# Patient Record
Sex: Female | Born: 1985
Health system: Southern US, Community
[De-identification: ages and names within clinical notes are randomized; demographics above are authoritative.]

## PROBLEM LIST (undated history)

## (undated) ENCOUNTER — Inpatient Hospital Stay (HOSPITAL_COMMUNITY): Payer: Self-pay

## (undated) DIAGNOSIS — R51 Headache: Secondary | ICD-10-CM

## (undated) DIAGNOSIS — R519 Headache, unspecified: Secondary | ICD-10-CM

## (undated) DIAGNOSIS — T8859XA Other complications of anesthesia, initial encounter: Secondary | ICD-10-CM

## (undated) DIAGNOSIS — Z8632 Personal history of gestational diabetes: Secondary | ICD-10-CM

## (undated) DIAGNOSIS — T4145XA Adverse effect of unspecified anesthetic, initial encounter: Secondary | ICD-10-CM

## (undated) DIAGNOSIS — F419 Anxiety disorder, unspecified: Secondary | ICD-10-CM

## (undated) DIAGNOSIS — F410 Panic disorder [episodic paroxysmal anxiety] without agoraphobia: Secondary | ICD-10-CM

## (undated) HISTORY — PX: MOUTH SURGERY: SHX715

## (undated) HISTORY — PX: OTHER SURGICAL HISTORY: SHX169

## (undated) HISTORY — PX: TUBAL LIGATION: SHX77

---
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
## 2005-04-23 ENCOUNTER — Encounter (INDEPENDENT_AMBULATORY_CARE_PROVIDER_SITE_OTHER): Payer: Self-pay | Admitting: *Deleted

## 2005-04-23 ENCOUNTER — Observation Stay (HOSPITAL_COMMUNITY): Admission: RE | Admit: 2005-04-23 | Discharge: 2005-04-24 | Payer: Self-pay | Admitting: Obstetrics and Gynecology

## 2006-11-29 ENCOUNTER — Emergency Department (HOSPITAL_COMMUNITY): Admission: EM | Admit: 2006-11-29 | Discharge: 2006-11-29 | Payer: Self-pay | Admitting: Emergency Medicine

## 2009-04-10 ENCOUNTER — Inpatient Hospital Stay (HOSPITAL_COMMUNITY): Admission: AD | Admit: 2009-04-10 | Discharge: 2009-04-10 | Payer: Self-pay | Admitting: Obstetrics & Gynecology

## 2009-05-07 ENCOUNTER — Inpatient Hospital Stay (HOSPITAL_COMMUNITY): Admission: AD | Admit: 2009-05-07 | Discharge: 2009-05-09 | Payer: Self-pay | Admitting: Obstetrics and Gynecology

## 2010-05-23 ENCOUNTER — Encounter
Admission: RE | Admit: 2010-05-23 | Discharge: 2010-06-12 | Payer: Self-pay | Source: Home / Self Care | Attending: Obstetrics and Gynecology | Admitting: Obstetrics and Gynecology

## 2010-06-28 ENCOUNTER — Inpatient Hospital Stay (HOSPITAL_COMMUNITY)
Admission: AD | Admit: 2010-06-28 | Discharge: 2010-06-30 | DRG: 775 | Disposition: A | Discharge status: Home / Self Care | Payer: Medicaid Other | Source: Ambulatory Visit | Attending: Obstetrics and Gynecology | Admitting: Obstetrics and Gynecology

## 2010-06-28 ENCOUNTER — Other Ambulatory Visit: Payer: Self-pay | Admitting: Obstetrics and Gynecology

## 2010-06-28 DIAGNOSIS — O99814 Abnormal glucose complicating childbirth: Secondary | ICD-10-CM | POA: Diagnosis present

## 2010-06-28 LAB — CBC
MCH: 31.6 pg (ref 26.0–34.0)
MCHC: 33.4 g/dL (ref 30.0–36.0)
MCV: 94.6 fL (ref 78.0–100.0)
Platelets: 188 10*3/uL (ref 150–400)
RBC: 3.89 MIL/uL (ref 3.87–5.11)
RDW: 14.1 % (ref 11.5–15.5)

## 2010-06-28 LAB — RPR: RPR Ser Ql: NONREACTIVE

## 2010-06-29 LAB — CBC
Hemoglobin: 11 g/dL — ABNORMAL LOW (ref 12.0–15.0)
MCH: 31.3 pg (ref 26.0–34.0)
MCHC: 32.5 g/dL (ref 30.0–36.0)
Platelets: 151 10*3/uL (ref 150–400)
RDW: 14 % (ref 11.5–15.5)

## 2010-07-20 ENCOUNTER — Inpatient Hospital Stay (HOSPITAL_COMMUNITY): Admission: AD | Admit: 2010-07-20 | Payer: Self-pay | Admitting: Obstetrics and Gynecology

## 2010-08-13 LAB — RPR: RPR Ser Ql: NONREACTIVE

## 2010-08-13 LAB — CBC
HCT: 32.9 % — ABNORMAL LOW (ref 36.0–46.0)
Hemoglobin: 11.1 g/dL — ABNORMAL LOW (ref 12.0–15.0)
Platelets: 172 10*3/uL (ref 150–400)
RBC: 3.39 MIL/uL — ABNORMAL LOW (ref 3.87–5.11)
RBC: 4.01 MIL/uL (ref 3.87–5.11)
RDW: 13.6 % (ref 11.5–15.5)
WBC: 12.5 10*3/uL — ABNORMAL HIGH (ref 4.0–10.5)
WBC: 17.8 10*3/uL — ABNORMAL HIGH (ref 4.0–10.5)

## 2010-08-15 LAB — URINE MICROSCOPIC-ADD ON

## 2010-08-15 LAB — GC/CHLAMYDIA PROBE AMP, GENITAL
Chlamydia, DNA Probe: NEGATIVE
GC Probe Amp, Genital: NEGATIVE

## 2010-08-15 LAB — URINALYSIS, ROUTINE W REFLEX MICROSCOPIC
Bilirubin Urine: NEGATIVE
Specific Gravity, Urine: 1.015 (ref 1.005–1.030)
Urobilinogen, UA: 0.2 mg/dL (ref 0.0–1.0)
pH: 7 (ref 5.0–8.0)

## 2010-08-15 LAB — WET PREP, GENITAL
Trich, Wet Prep: NONE SEEN
Yeast Wet Prep HPF POC: NONE SEEN

## 2010-09-28 NOTE — Op Note (Signed)
Samantha [name], Samantha [name]                ACCOUNT NO.:  [id number]   MEDICAL RECORD NO.:  [id number]          PATIENT TYPE:  OBV   LOCATION:  9306                          FACILITY:  [facility]   PHYSICIAN:  Carrington Clamp, M.D. DATE OF BIRTH:  [birth year]   DATE OF PROCEDURE:  04/23/2005  DATE OF DISCHARGE:                                 OPERATIVE REPORT   PREOPERATIVE DIAGNOSIS:  Is right lower quadrant pain with a left mass  associated with the left ovary.   POSTOPERATIVE DIAGNOSIS:  Is left TOA and adhesions.   PROCEDURES:  Diagnostic operative laparoscopy with left salpingectomy and  lysis of adhesions.   SURGEON:  Carrington Clamp, M.D.   ASSISTANT:  Dr. Greta Doom   ANESTHESIA:  General.   SPECIMENS:  Left salpinx. Culture was also sent of the pus.   ESTIMATED BLOOD LOSS:  Was minimal.   IV FLUIDS:  1500 cc.   URINE OUTPUT:  Was not measured.   COMPLICATIONS:  Were none.   FINDINGS:  Were left tubal ovarian abscess with the bowel and the omentum  adhesed to the left tube. The bowel was intact. The left tube was grossly  abnormal, dilated with an irregular shape. The right tube appeared to be  normal except for some clubbing at the end and small amount adhesions to the  ovary. The right ovary was adhesed to the appendix at the tip but the  appendix was otherwise normal. There no Fitz-Hugh-Curtis adhesions. Both  ovaries and uterus appeared normal. Medications are Hyskon and Interceed.  Counts were correct x3.   TECHNIQUE:  After adequate general anesthesia was achieved the patient was  prepped, draped in sterile fashion in dorsal lithotomy position. A speculum  was placed in the vagina and the uterine manipulator placed on the cervix.  The bladder was drained with a red rubber catheter during the procedure and  the speculum was removed from the vagina. Attention was turned to the  umbilicus where a 1 cm infraumbilical incision was made with the scalpel.  The Veress needle was  passed into the abdomen without aspiration of bowel  contents or blood. The 10 mm trocar was passed into the abdomen without  complication. The two 5 mm trocars placed one midline and one to the left of  the round ligament. The large mass of omentum and bowel was noted adhesed to  the anterior abdominal wall and this was carefully retracted with blunt  dissection with the probe and with the suction dilator. Pus was seen  emerging from this area and a sample pus was sent for culture. The blunt  dissection was continued until the omentum was removed and the tube and  bowel could be seen. Careful sharp dissection was used to retract the tube  from the anterior abdomen and the tube and bowel were then removed from each  other with careful blunt and sharp dissection. The bowel was intact and was  uninjured and the left tube was identified as being grossly dilated and  abnormal. It was difficult to see the fimbria but it was believed that the  fimbria were intact. However, they had been grossly adhesed and there was a  lot of the oozing and some bleeding of this area. Because of the likelihood  that this tube with ever be functional and because of the amount of bleeding  that was being seen from the bed of the tube it was decided to remove the  tube as she looked like she had a right tube that was healthier. The triple  polar was used to remove the tube at the cornea and from the mesosalpinx.  All the dissection was done superior to the ovary and therefore the  infundibular pelvic ligament and the ureters were well out of the field of  dissection. The tube was then removed in an Endo bag and the remaining stump  at the cornua and the incision bed was hemostatic. A piece of intercede was  placed over the entire area of both the bowel and the mesosalpinx to try and  cover it to prevent readhesion of the omentum and or any other bowel. The  water and blood were irrigated from the abdomen and then 50  mL of Hyskon  were injected. Before this had happened the appendix was released from the  right ovary with blunt dissection. It was intact and without abnormalities  and therefore was left alone, tucked up towards the cecum again. The  instruments were then withdrawn from the abdomen and the abdomen  desufflated. The 10 mL trocar site fascia was closed with a figure-of-eight  stitch of 2-0 Vicryl. Two through and through stitches were used to close  each of the smaller 5 mm incision and then all skin incisions were closed  with Dermabond. All instruments withdrawn from the vagina and the patient  tolerated the procedure well. She returned to recovery room in stable  condition.      Carrington Clamp, M.D.  Electronically Signed     MH/MEDQ  D:  04/23/2005  T:  04/23/2005  Job:  [id number]

## 2010-09-28 NOTE — Discharge Summary (Signed)
NAME:  Samantha [name], Samantha [name]                          ACCOUNT NO.:  [id number]   MEDICAL RECORD NO.:  [id number]                   PATIENT TYPE:  INP   LOCATION:  9125                                 FACILITY:  [facility]   PHYSICIAN:  Gerrit Friends. Aldona Bar, M.D.                DATE OF BIRTH:  02-Jan-1986   DATE OF ADMISSION:  05/21/2003  DATE OF DISCHARGE:  05/23/2003                                 DISCHARGE SUMMARY   DISCHARGE DIAGNOSES:  1. Term pregnancy delivered, 6 pound, one ounce female infant with Apgar's     of 8 and 9.  2. Blood type O positive.   PROCEDURES:  1. Normal spontaneous delivery.  2. Second degree tear and repair.   SUMMARY:  This 25 year old primigravida was admitted at term in active  labor.  She progressed and had a normal spontaneous delivery over a second  degree tear of a 6 pound, one ounce female infant with good Apgar's.  The  tear was repaired without difficulty and the patient's postpartum course was  benign.   Discharge hemoglobin 11.3 with a white count of 15,500 and a platelet count  of 157,000.  On the morning of May 23, 2003 she was ambulating well,  tolerating a regular diet well, having normal bowel and bladder function and  was afebrile.  She was breast feeding without difficulty and was discharged  to home.   She was given all appropriate instructions and understood instructions well.   DISCHARGE MEDICATIONS:  Vitamins - one a day, and Motrin 600 mg q.6h p.r.n.  for pain.   FOLLOW UP:  She will return to the office for follow up in approximately  four week's time or as needed.   Condition on discharge is improved.                                               Gerrit Friends. Aldona Bar, M.D.    RMW/MEDQ  D:  05/23/2003  T:  05/23/2003  Job:  [id number]

## 2011-02-25 LAB — URINALYSIS, ROUTINE W REFLEX MICROSCOPIC
Nitrite: NEGATIVE
Specific Gravity, Urine: 1.019
Urobilinogen, UA: 0.2
pH: 6.5

## 2011-02-25 LAB — GC/CHLAMYDIA PROBE AMP, GENITAL: Chlamydia, DNA Probe: NEGATIVE

## 2011-02-25 LAB — POCT PREGNANCY, URINE
Operator id: 196461
Preg Test, Ur: NEGATIVE

## 2011-02-25 LAB — URINE MICROSCOPIC-ADD ON

## 2011-06-06 ENCOUNTER — Ambulatory Visit: Payer: 59 | Attending: Family Medicine

## 2011-06-06 DIAGNOSIS — M25559 Pain in unspecified hip: Secondary | ICD-10-CM | POA: Insufficient documentation

## 2011-06-06 DIAGNOSIS — M545 Low back pain, unspecified: Secondary | ICD-10-CM | POA: Insufficient documentation

## 2011-06-06 DIAGNOSIS — IMO0001 Reserved for inherently not codable concepts without codable children: Secondary | ICD-10-CM | POA: Insufficient documentation

## 2011-06-06 DIAGNOSIS — F411 Generalized anxiety disorder: Secondary | ICD-10-CM | POA: Insufficient documentation

## 2011-06-10 ENCOUNTER — Ambulatory Visit: Payer: 59 | Admitting: Physical Therapy

## 2011-06-13 ENCOUNTER — Ambulatory Visit: Payer: 59 | Admitting: Physical Therapy

## 2011-06-18 ENCOUNTER — Ambulatory Visit: Payer: 59 | Attending: Family Medicine | Admitting: Physical Therapy

## 2011-06-18 DIAGNOSIS — M545 Low back pain, unspecified: Secondary | ICD-10-CM | POA: Insufficient documentation

## 2011-06-18 DIAGNOSIS — IMO0001 Reserved for inherently not codable concepts without codable children: Secondary | ICD-10-CM | POA: Insufficient documentation

## 2011-06-18 DIAGNOSIS — M25559 Pain in unspecified hip: Secondary | ICD-10-CM | POA: Insufficient documentation

## 2011-06-18 DIAGNOSIS — F411 Generalized anxiety disorder: Secondary | ICD-10-CM | POA: Insufficient documentation

## 2011-06-20 ENCOUNTER — Ambulatory Visit: Payer: 59 | Admitting: Physical Therapy

## 2011-06-25 ENCOUNTER — Encounter: Payer: 59 | Admitting: Physical Therapy

## 2011-06-27 ENCOUNTER — Ambulatory Visit: Payer: 59 | Admitting: Physical Therapy

## 2013-01-08 ENCOUNTER — Encounter: Payer: Self-pay | Admitting: Radiology

## 2015-02-24 ENCOUNTER — Inpatient Hospital Stay (HOSPITAL_COMMUNITY)
Admission: AD | Admit: 2015-02-24 | Discharge: 2015-02-25 | Discharge status: Against Medical Advice | Payer: Medicaid Other | Source: Ambulatory Visit | Attending: Obstetrics and Gynecology | Admitting: Obstetrics and Gynecology

## 2015-02-24 DIAGNOSIS — Z5320 Procedure and treatment not carried out because of patient's decision for unspecified reasons: Secondary | ICD-10-CM | POA: Diagnosis not present

## 2015-02-24 DIAGNOSIS — R109 Unspecified abdominal pain: Secondary | ICD-10-CM | POA: Diagnosis present

## 2015-02-24 LAB — URINALYSIS, ROUTINE W REFLEX MICROSCOPIC
Bilirubin Urine: NEGATIVE
Glucose, UA: NEGATIVE mg/dL
Hgb urine dipstick: NEGATIVE
Ketones, ur: NEGATIVE mg/dL
LEUKOCYTES UA: NEGATIVE
NITRITE: NEGATIVE
PH: 6 (ref 5.0–8.0)
Protein, ur: NEGATIVE mg/dL
SPECIFIC GRAVITY, URINE: 1.025 (ref 1.005–1.030)
Urobilinogen, UA: 0.2 mg/dL (ref 0.0–1.0)

## 2015-02-24 LAB — POCT PREGNANCY, URINE: Preg Test, Ur: POSITIVE — AB

## 2015-02-24 NOTE — MAU Note (Signed)
Positive home upt today. LMP 01/25/15. Occ cramping but no bleeding.

## 2015-02-25 NOTE — MAU Note (Signed)
Pt not in lobby.  

## 2015-02-25 NOTE — MAU Note (Signed)
Lab called pt earlier from lobby for blood work and pt not in lobby. PT now called and not in lobby.

## 2015-02-26 ENCOUNTER — Encounter (HOSPITAL_COMMUNITY): Payer: Self-pay | Admitting: *Deleted

## 2015-02-26 ENCOUNTER — Inpatient Hospital Stay (HOSPITAL_COMMUNITY): Payer: Medicaid Other

## 2015-02-26 ENCOUNTER — Inpatient Hospital Stay (HOSPITAL_COMMUNITY)
Admission: AD | Admit: 2015-02-26 | Discharge: 2015-02-26 | Disposition: A | Discharge status: Home / Self Care | Payer: Medicaid Other | Source: Ambulatory Visit | Attending: Obstetrics & Gynecology | Admitting: Obstetrics & Gynecology

## 2015-02-26 DIAGNOSIS — O4691 Antepartum hemorrhage, unspecified, first trimester: Secondary | ICD-10-CM

## 2015-02-26 DIAGNOSIS — O26899 Other specified pregnancy related conditions, unspecified trimester: Secondary | ICD-10-CM

## 2015-02-26 DIAGNOSIS — O209 Hemorrhage in early pregnancy, unspecified: Secondary | ICD-10-CM

## 2015-02-26 DIAGNOSIS — Z3491 Encounter for supervision of normal pregnancy, unspecified, first trimester: Secondary | ICD-10-CM | POA: Insufficient documentation

## 2015-02-26 DIAGNOSIS — R109 Unspecified abdominal pain: Secondary | ICD-10-CM

## 2015-02-26 HISTORY — DX: Personal history of gestational diabetes: Z86.32

## 2015-02-26 LAB — CBC
HCT: 40.2 % (ref 36.0–46.0)
Hemoglobin: 13.6 g/dL (ref 12.0–15.0)
MCH: 33.5 pg (ref 26.0–34.0)
MCHC: 33.8 g/dL (ref 30.0–36.0)
MCV: 99 fL (ref 78.0–100.0)
PLATELETS: 181 10*3/uL (ref 150–400)
RBC: 4.06 MIL/uL (ref 3.87–5.11)
RDW: 12.7 % (ref 11.5–15.5)
WBC: 8.8 10*3/uL (ref 4.0–10.5)

## 2015-02-26 LAB — URINALYSIS, ROUTINE W REFLEX MICROSCOPIC
BILIRUBIN URINE: NEGATIVE
Glucose, UA: NEGATIVE mg/dL
Hgb urine dipstick: NEGATIVE
KETONES UR: NEGATIVE mg/dL
LEUKOCYTES UA: NEGATIVE
NITRITE: NEGATIVE
PH: 6 (ref 5.0–8.0)
PROTEIN: NEGATIVE mg/dL
Specific Gravity, Urine: >1.03 — ABNORMAL HIGH (ref 1.005–1.030)
UROBILINOGEN UA: 0.2 mg/dL (ref 0.0–1.0)

## 2015-02-26 LAB — HCG, QUANTITATIVE, PREGNANCY: HCG, BETA CHAIN, QUANT, S: 1836 m[IU]/mL — AB (ref <5)

## 2015-02-26 LAB — ABO/RH: ABO/RH(D): O POS

## 2015-02-26 LAB — WET PREP, GENITAL
CLUE CELLS WET PREP: NONE SEEN
TRICH WET PREP: NONE SEEN
YEAST WET PREP: NONE SEEN

## 2015-02-26 NOTE — MAU Provider Note (Signed)
Chief Complaint: Abdominal Pain   First Provider Initiated Contact with Patient 03/18/2015 at 02:53 PM   SUBJECTIVE HPI: Samantha [name] is a 29 y.o. G4P3003 at [redacted]w[redacted]d who presents to [facility] pain and pink spotting.   Location: Low abd Quality: cramping Severity: 3/10 on pain scale Duration: <24 hours Course: Unchanged Context: none Timing: Constant Modifying factors: None. Hasn't tried anything for pain Associated signs and symptoms: Pos for spotting. Neg for fever, chills, passage of clots of tissue, vaginal discharge.   Past Medical History  Diagnosis Date  . History of gestational diabetes    OB History  Gravida Para Term Preterm AB SAB TAB Ectopic Multiple Living  # Outcome Date GA Lbr Len/2nd Weight Sex Delivery Anes PTL Lv  4 Current           3 Term      Vag-Spont     2 Term      Vag-Spont     1 Term      Vag-Spont        Past Surgical History  Procedure Laterality Date  . Tonsills remove    . Fallopian tube removal    . Mouth surgery     Social History   Social History  . Marital Status: Single    Spouse Name: N/A  . Number of Children: N/A  . Years of Education: N/A   Occupational History  . Not on file.   Social History Main Topics  . Smoking status: Current Some Day Smoker  . Smokeless tobacco: Not on file  . Alcohol Use: No  . Drug Use: No  . Sexual Activity: Yes    Birth Control/ Protection: None   Other Topics Concern  . Not on file   Social History Narrative   No current facility-administered medications on file prior to encounter.   No current outpatient prescriptions on file prior to encounter.   No Known Allergies  I have reviewed the past Medical Hx, Surgical Hx, Social Hx, Allergies and Medications.   abdominal pain in the lower midline, vaginal bleeding. Neg for dysuria, hemeturia, urgency, frequency, flank pain, fever, chills, diarrhea, constipation, passage or clots or  tissue.  OBJECTIVE Patient Vitals for the past 24 hrs:  BP Temp Temp src Pulse Resp Height Weight  02/26/15 1145 110/75 mmHg 98 F (36.7 C) Oral 82 16 5' 4.25" (1.632 m) 101 lb 4 oz (45.927 kg)   Constitutional: Well-developed, well-nourished female in no acute distress.  Cardiovascular: normal rate Respiratory: normal rate and effort.  GI: Abd soft, non-tender. Fundus non-palpable. MS: Extremities nontender, no edema, normal ROM Neurologic: Alert and oriented x 4.  GU: Neg CVAT.  SPECULUM EXAM: NEFG, physiologic discharge, no blood noted, cervix clean  BIMANUAL: cervix closed; uterus normal size, no adnexal tenderness or masses. No CMT.  LAB RESULTS Results for orders placed or performed during the hospital encounter of 02/26/15 (from the past 24 hour(s))  Urinalysis, Routine w reflex microscopic (not at [facility])     Status: Abnormal   Collection Time: 02/26/15 11:54 AM  Result Value Ref Range   Color, Urine YELLOW YELLOW   APPearance CLEAR CLEAR   Specific Gravity, Urine >1.030 (H) 1.005 - 1.030   pH 6.0 5.0 - 8.0   Glucose, UA NEGATIVE NEGATIVE mg/dL   Hgb urine dipstick NEGATIVE NEGATIVE   Bilirubin Urine NEGATIVE NEGATIVE   Ketones, ur NEGATIVE NEGATIVE mg/dL  Protein, ur NEGATIVE NEGATIVE mg/dL   Urobilinogen, UA 0.2 0.0 - 1.0 mg/dL   Nitrite NEGATIVE NEGATIVE   Leukocytes, UA NEGATIVE NEGATIVE  hCG, quantitative, pregnancy     Status: Abnormal   Collection Time: 02/26/15 12:25 PM  Result Value Ref Range   hCG, Beta Chain, Quant, S 1836 (H) <5 mIU/mL  CBC     Status: None   Collection Time: 02/26/15 12:25 PM  Result Value Ref Range   WBC 8.8 4.0 - 10.5 K/uL   RBC 4.06 3.87 - 5.11 MIL/uL   Hemoglobin 13.6 12.0 - 15.0 g/dL   HCT 93.740.2 90.236.0 - 40.946.0 %   MCV 99.0 78.0 - 100.0 fL   MCH 33.5 26.0 - 34.0 pg   MCHC 33.8 30.0 - 36.0 g/dL   RDW 73.512.7 32.911.5 - 92.415.5 %   Platelets 181 150 - 400 K/uL  ABO/Rh     Status: None   Collection Time: 02/26/15 12:25 PM  Result Value Ref  Range   ABO/RH(D) O POS     IMAGING [place] Ob Comp Less 14 Wks  02/26/2015  CLINICAL DATA:  Abdominal pain and bleeding. 4 weeks 4 days pregnant. Beta HCG of 1,800. EXAM: OBSTETRIC <14 WK US AND TRANSVAGINAL OB US TECHNIQUE: Both transabdominal and transvaginal ultrasound examinations were performed for complete evaluation of the gestation as well as the maternal uterus, adnexal regions, and pelvic cul-de-sac. Transvaginal technique was performed to assess early pregnancy. COMPARISON:  Pelvic ultrasound of 11/29/2006 FINDINGS: Intrauterine gestational sac: Likely visualized; tiny fluid collection within the endometrium. Yolk sac:  Absent Embryo:  Absent Cardiac Activity: Absent MSD: 3  mm   5 w   0  d Maternal uterus/adnexae: No evidence of subchorionic hemorrhage. Probable right ovarian corpus luteal cyst. Normal left ovary. Trace free pelvic fluid is likely physiologic. IMPRESSION: 1. Tiny fluid collection within the endometrium is likely an early gestational sac. This corresponds to a gestational age of approximately 5 weeks 0 days. 2. Right ovarian corpus luteal cyst. Electronically Signed   By: Jeronimo GreavesKyle  Talbot M.D.   On: 02/26/2015 13:59   [place] Ob Transvaginal  02/26/2015  CLINICAL DATA:  Abdominal pain and bleeding. 4 weeks 4 days pregnant. Beta HCG of 1,800. EXAM: OBSTETRIC <14 WK US AND TRANSVAGINAL OB US TECHNIQUE: Both transabdominal and transvaginal ultrasound examinations were performed for complete evaluation of the gestation as well as the maternal uterus, adnexal regions, and pelvic cul-de-sac. Transvaginal technique was performed to assess early pregnancy. COMPARISON:  Pelvic ultrasound of 11/29/2006 FINDINGS: Intrauterine gestational sac: Likely visualized; tiny fluid collection within the endometrium. Yolk sac:  Absent Embryo:  Absent Cardiac Activity: Absent MSD: 3  mm   5 w   0  d Maternal uterus/adnexae: No evidence of subchorionic hemorrhage. Probable right ovarian corpus luteal cyst. Normal  left ovary. Trace free pelvic fluid is likely physiologic. IMPRESSION: 1. Tiny fluid collection within the endometrium is likely an early gestational sac. This corresponds to a gestational age of approximately 5 weeks 0 days. 2. Right ovarian corpus luteal cyst. Electronically Signed   By: Jeronimo GreavesKyle  Talbot M.D.   On: 02/26/2015 13:59    MAU COURSE CBC, CMP, ABO/Rh, ultrasound, wet prep and GC/chlamydia culture, UA  MDM Pain and bleeding in early pregnancy with pregnancy of unknown anatomic location, but hemodynamically stable.  ASSESSMENT 1. Vaginal bleeding in pregnancy, first trimester   2. Abdominal pain affecting pregnancy, antepartum   3. Vaginal bleeding in pregnancy, first trimester   4. Abdominal pain  affecting pregnancy, antepartum    PLAN Discharge home in stable condition. Bleeding precautions Follow-up Information    Follow up with [facility] [place] [facility] In 2 days.   Why:  For repeat blood work or sooner as needed if symptoms worsen   Contact information:   [address] [id number] mc [place] [address] [phone]       Medication List    TAKE these medications        [facility] PRENATAL GUMMY PO  Take 2 capsules by mouth daily.         [place], CNM 02/26/2015  2:53 PM

## 2015-02-26 NOTE — MAU Note (Signed)
Patient presents at [redacted] weeks gestation with c/o abdominal pain since last night. Denies bleeding but did note a pink discharge when wiping after voiding this morning.

## 2015-02-26 NOTE — Discharge Instructions (Signed)
Abdominal Pain During Pregnancy Abdominal pain is common in pregnancy. Most of the time, it does not cause harm. There are many causes of abdominal pain. Some causes are more serious than others. Some of the causes of abdominal pain in pregnancy are easily diagnosed. Occasionally, the diagnosis takes time to understand. Other times, the cause is not determined. Abdominal pain can be a sign that something is very wrong with the pregnancy, or the pain may have nothing to do with the pregnancy at all. For this reason, always tell your health care provider if you have any abdominal discomfort. HOME CARE INSTRUCTIONS  Monitor your abdominal pain for any changes. The following actions may help to alleviate any discomfort you are experiencing:  Do not have sexual intercourse or put anything in your vagina until your symptoms go away completely.  Get plenty of rest until your pain improves.  Drink clear fluids if you feel nauseous. Avoid solid food as long as you are uncomfortable or nauseous.  Only take over-the-counter or prescription medicine as directed by your health care provider.  Keep all follow-up appointments with your health care provider. SEEK IMMEDIATE MEDICAL CARE IF:  You are bleeding, leaking fluid, or passing tissue from the vagina.  You have increasing pain or cramping.  You have persistent vomiting.  You have painful or bloody urination.  You have a fever.  You notice a decrease in your baby's movements.  You have extreme weakness or feel faint.  You have shortness of breath, with or without abdominal pain.  You develop a severe headache with abdominal pain.  You have abnormal vaginal discharge with abdominal pain.  You have persistent diarrhea.  You have abdominal pain that continues even after rest, or gets worse. MAKE SURE YOU:   Understand these instructions.  Will watch your condition.  Will get help right away if you are not doing well or get worse.     This information is not intended to replace advice given to you by your health care provider. Make sure you discuss any questions you have with your health care provider.   Document Released: 04/29/2005 Document Revised: 02/17/2013 Document Reviewed: 11/26/2012 Elsevier Interactive Patient Education 2016 Elsevier Inc.  Ectopic Pregnancy An ectopic pregnancy is when the fertilized egg attaches (implants) outside the uterus. Most ectopic pregnancies occur in the fallopian tube. Rarely do ectopic pregnancies occur on the ovary, intestine, pelvis, or cervix. In an ectopic pregnancy, the fertilized egg does not have the ability to develop into a normal, healthy baby.  A ruptured ectopic pregnancy is one in which the fallopian tube gets torn or bursts and results in internal bleeding. Often there is intense abdominal pain, and sometimes, vaginal bleeding. Having an ectopic pregnancy can be life threatening. If left untreated, this dangerous condition can lead to a blood transfusion, abdominal surgery, or even death. CAUSES  Damage to the fallopian tubes is the suspected cause in most ectopic pregnancies.  RISK FACTORS Depending on your circumstances, the risk of having an ectopic pregnancy will vary. The level of risk can be divided into three categories. High Risk  You have gone through infertility treatment.  You have had a previous ectopic pregnancy.  You have had previous tubal surgery.  You have had previous surgery to have the fallopian tubes tied (tubal ligation).  You have tubal problems or diseases.  You have been exposed to DES. DES is a medicine that was used until 1971 and had effects on babies whose mothers took the  medicine.  You become pregnant while using an intrauterine device (IUD) for birth control. Moderate Risk  You have a history of infertility.  You have a history of a sexually transmitted infection (STI).  You have a history of pelvic inflammatory disease  (PID).  You have scarring from endometriosis.  You have multiple sexual partners.  You smoke. Low Risk  You have had previous pelvic surgery.  You use vaginal douching.  You became sexually active before 29 years of age. SIGNS AND SYMPTOMS  An ectopic pregnancy should be suspected in anyone who has missed a period and has abdominal pain or bleeding.  You may experience normal pregnancy symptoms, such as:  Nausea.  Tiredness.  Breast tenderness.  Other symptoms may include:  Pain with intercourse.  Irregular vaginal bleeding or spotting.  Cramping or pain on one side or in the lower abdomen.  Fast heartbeat.  Passing out while having a bowel movement.  Symptoms of a ruptured ectopic pregnancy and internal bleeding may include:  Sudden, severe pain in the abdomen and pelvis.  Dizziness or fainting.  Pain in the shoulder area. DIAGNOSIS  Tests that may be performed include:  A pregnancy test.  An ultrasound test.  Testing the specific level of pregnancy hormone in the bloodstream.  Taking a sample of uterus tissue (dilation and curettage, D&C).  Surgery to perform a visual exam of the inside of the abdomen using a thin, lighted tube with a tiny camera on the end (laparoscope). TREATMENT  An injection of a medicine called methotrexate may be given. This medicine causes the pregnancy tissue to be absorbed. It is given if:  The diagnosis is made early.  The fallopian tube has not ruptured.  You are considered to be a good candidate for the medicine. Usually, pregnancy hormone blood levels are checked after methotrexate treatment. This is to be sure the medicine is effective. It may take 4-6 weeks for the pregnancy to be absorbed (though most pregnancies will be absorbed by 3 weeks). Surgical treatment may be needed. A laparoscope may be used to remove the pregnancy tissue. If severe internal bleeding occurs, a cut (incision) may be made in the lower abdomen  (laparotomy), and the ectopic pregnancy is removed. This stops the bleeding. Part of the fallopian tube, or the whole tube, may be removed as well (salpingectomy). After surgery, pregnancy hormone tests may be done to be sure there is no pregnancy tissue left. You may receive a Rho (D) immune globulin shot if you are Rh negative and the father is Rh positive, or if you do not know the Rh type of the father. This is to prevent problems with any future pregnancy. SEEK IMMEDIATE MEDICAL CARE IF:  You have any symptoms of an ectopic pregnancy. This is a medical emergency. MAKE SURE YOU:  Understand these instructions.  Will watch your condition.  Will get help right away if you are not doing well or get worse.   This information is not intended to replace advice given to you by your health care provider. Make sure you discuss any questions you have with your health care provider.   Document Released: 06/06/2004 Document Revised: 05/20/2014 Document Reviewed: 11/26/2012 Elsevier Interactive Patient Education [employer]2016 Elsevier Inc.

## 2015-02-27 LAB — GC/CHLAMYDIA PROBE AMP (~~LOC~~) NOT AT ARMC
Chlamydia: NEGATIVE
Neisseria Gonorrhea: NEGATIVE

## 2015-02-27 LAB — HIV ANTIBODY (ROUTINE TESTING W REFLEX): HIV Screen 4th Generation wRfx: NONREACTIVE

## 2015-03-05 ENCOUNTER — Inpatient Hospital Stay (HOSPITAL_COMMUNITY): Payer: Medicaid Other

## 2015-03-05 ENCOUNTER — Inpatient Hospital Stay (HOSPITAL_COMMUNITY)
Admission: AD | Admit: 2015-03-05 | Discharge: 2015-03-05 | Disposition: A | Discharge status: Home / Self Care | Payer: Medicaid Other | Source: Ambulatory Visit | Attending: Family Medicine | Admitting: Family Medicine

## 2015-03-05 DIAGNOSIS — Z3491 Encounter for supervision of normal pregnancy, unspecified, first trimester: Secondary | ICD-10-CM

## 2015-03-05 DIAGNOSIS — Z3A01 Less than 8 weeks gestation of pregnancy: Secondary | ICD-10-CM | POA: Diagnosis not present

## 2015-03-05 DIAGNOSIS — R102 Pelvic and perineal pain: Secondary | ICD-10-CM | POA: Diagnosis present

## 2015-03-05 DIAGNOSIS — O26891 Other specified pregnancy related conditions, first trimester: Secondary | ICD-10-CM | POA: Diagnosis not present

## 2015-03-05 NOTE — MAU Provider Note (Signed)
S: 29 y.o. [id number] @[redacted]w[redacted]d  by LMP/U/S presents to MAU for follow up quant hcg. She presented on 02/26/15 with abdominal pain and spotting with positive home pregnancy test.  She had quant hcg of 1836 on 10/16 with no evidence of IUP or ectopic on ultrasound.  She did not return on 10/18 as instructed but has returned today to MAU for follow up.  She denies pain or bleeding today.  Both resolved spontaneously without treatment.  O: [place] Ob Transvaginal  03/05/2015  CLINICAL DATA:  Pregnant, pain, evaluate for viability EXAM: TRANSVAGINAL OB ULTRASOUND TECHNIQUE: Transvaginal ultrasound was performed for complete evaluation of the gestation as well as the maternal uterus, adnexal regions, and pelvic cul-de-sac. COMPARISON:  02/26/2015 FINDINGS: Intrauterine gestational sac: Visualized/normal in shape. Yolk sac:  Present Embryo:  Not visualized MSD: 11  mm   5 w   6  d Maternal uterus/adnexae: No subchronic hemorrhage. Bilateral ovaries are within normal limits. No free fluid. IMPRESSION: Single intrauterine gestational sac with yolk sac, measuring 5 weeks 6 days by mean sac diameter. No fetal pole is visualized. Consider follow-up pelvic ultrasound in 14 days to confirm viability as clinically warranted. Electronically Signed   By: Charline BillsSriyesh  Krishnan M.D.   On: 03/05/2015 10:50   VS reviewed, nursing note reviewed,  Constitutional: well developed, well nourished, no distress HEENT: normocephalic CV: normal rate Pulm/chest wall: normal effort Abdomen: soft Neuro: alert and oriented x 3 Skin: warm, dry Psych: affect normal  --/--/O POS (10/16 1225)  A: 1. Normal IUP (intrauterine pregnancy) on prenatal ultrasound, first trimester   2. Pelvic pain affecting pregnancy in first trimester, antepartum    P: D/C home [facility] First trimester teaching/precautions given Return to MAU as needed for emergencies   LEFTWICH-KIRBY, Gavin Telford, CNM 11:33 AM

## 2015-03-05 NOTE — MAU Note (Signed)
Was unable to return when expected, "kids were out of school".  No real complaints today.  No pain or bleeding.

## 2015-03-05 NOTE — Discharge Instructions (Signed)
First Trimester of Pregnancy  The first trimester of pregnancy is from week 1 until the end of week 12 (months 1 through 3). A week after a sperm fertilizes an egg, the egg will implant on the wall of the uterus. This embryo will begin to develop into a baby. Genes from you and your partner are forming the baby. The female genes determine whether the baby is a boy or a girl. At 6-8 weeks, the eyes and face are formed, and the heartbeat can be seen on ultrasound. At the end of 12 weeks, all the baby's organs are formed.   Now that you are pregnant, you will want to do everything you can to have a healthy baby. Two of the most important things are to get good prenatal care and to follow your health care provider's instructions. Prenatal care is all the medical care you receive before the baby's birth. This care will help prevent, find, and treat any problems during the pregnancy and childbirth.  BODY CHANGES  Your body goes through many changes during pregnancy. The changes vary from woman to woman.   · You may gain or lose a couple of pounds at first.  · You may feel sick to your stomach (nauseous) and throw up (vomit). If the vomiting is uncontrollable, call your health care provider.  · You may tire easily.  · You may develop headaches that can be relieved by medicines approved by your health care provider.  · You may urinate more often. Painful urination may mean you have a bladder infection.  · You may develop heartburn as a result of your pregnancy.  · You may develop constipation because certain hormones are causing the muscles that push waste through your intestines to slow down.  · You may develop hemorrhoids or swollen, bulging veins (varicose veins).  · Your breasts may begin to grow larger and become tender. Your nipples may stick out more, and the tissue that surrounds them (areola) may become darker.  · Your gums may bleed and may be sensitive to brushing and flossing.   · Dark spots or blotches (chloasma, mask of pregnancy) may develop on your face. This will likely fade after the baby is born.  · Your menstrual periods will stop.  · You may have a loss of appetite.  · You may develop cravings for certain kinds of food.  · You may have changes in your emotions from day to day, such as being excited to be pregnant or being concerned that something may go wrong with the pregnancy and baby.  · You may have more vivid and strange dreams.  · You may have changes in your hair. These can include thickening of your hair, rapid growth, and changes in texture. Some women also have hair loss during or after pregnancy, or hair that feels dry or thin. Your hair will most likely return to normal after your baby is born.  WHAT TO EXPECT AT YOUR PRENATAL VISITS  During a routine prenatal visit:  · You will be weighed to make sure you and the baby are growing normally.  · Your blood pressure will be taken.  · Your abdomen will be measured to track your baby's growth.  · The fetal heartbeat will be listened to starting around week 10 or 12 of your pregnancy.  · Test results from any previous visits will be discussed.  Your health care provider may ask you:  · How you are feeling.  · If you   including cigarettes, chewing tobacco, and electronic cigarettes. °· If you have any questions. °Other tests that may be performed during your first trimester include: °· Blood tests to find your blood type and to check for the presence of any previous infections. They will also be used to check for low iron levels (anemia) and Rh antibodies. Later in the pregnancy, blood tests for diabetes will be done along with other tests if problems develop. °· Urine tests to check for infections, diabetes, or protein in the urine. °· An ultrasound to confirm the proper growth  and development of the baby. °· An amniocentesis to check for possible genetic problems. °· Fetal screens for spina bifida and Down syndrome. °· You may need other tests to make sure you and the baby are doing well. °· HIV (human immunodeficiency virus) testing. Routine prenatal testing includes screening for HIV, unless you choose not to have this test. °HOME CARE INSTRUCTIONS  °Medicines °· Follow your health care provider's instructions regarding medicine use. Specific medicines may be either safe or unsafe to take during pregnancy. °· Take your prenatal vitamins as directed. °· If you develop constipation, try taking a stool softener if your health care provider approves. °Diet °· Eat regular, well-balanced meals. Choose a variety of foods, such as meat or vegetable-based protein, fish, milk and low-fat dairy products, vegetables, fruits, and whole grain breads and cereals. Your health care provider will help you determine the amount of weight gain that is right for you. °· Avoid raw meat and uncooked cheese. These carry germs that can cause birth defects in the baby. °· Eating four or five small meals rather than three large meals a day may help relieve nausea and vomiting. If you start to feel nauseous, eating a few soda crackers can be helpful. Drinking liquids between meals instead of during meals also seems to help nausea and vomiting. °· If you develop constipation, eat more high-fiber foods, such as fresh vegetables or fruit and whole grains. Drink enough fluids to keep your urine clear or pale yellow. °Activity and Exercise °· Exercise only as directed by your health care provider. Exercising will help you: °¨ Control your weight. °¨ Stay in shape. °¨ Be prepared for labor and delivery. °· Experiencing pain or cramping in the lower abdomen or low back is a good sign that you should stop exercising. Check with your health care provider before continuing normal exercises. °· Try to avoid standing for long  periods of time. Move your legs often if you must stand in one place for a long time. °· Avoid heavy lifting. °· Wear low-heeled shoes, and practice good posture. °· You may continue to have sex unless your health care provider directs you otherwise. °Relief of Pain or Discomfort °· Wear a good support bra for breast tenderness.   °· Take warm sitz baths to soothe any pain or discomfort caused by hemorrhoids. Use hemorrhoid cream if your health care provider approves.   °· Rest with your legs elevated if you have leg cramps or low back pain. °· If you develop varicose veins in your legs, wear support hose. Elevate your feet for 15 minutes, 3-4 times a day. Limit salt in your diet. °Prenatal Care °· Schedule your prenatal visits by the twelfth week of pregnancy. They are usually scheduled monthly at first, then more often in the last 2 months before delivery. °· Write down your questions. Take them to your prenatal visits. °· Keep all your prenatal visits as directed by your   health care provider. Safety  Wear your seat belt at all times when driving.  Make a list of emergency phone numbers, including numbers for family, friends, the hospital, and police and fire departments. General Tips  Ask your health care provider for a referral to a local prenatal education class. Begin classes no later than at the beginning of month 6 of your pregnancy.  Ask for help if you have counseling or nutritional needs during pregnancy. Your health care provider can offer advice or refer you to specialists for help with various needs.  Do not use hot tubs, steam rooms, or saunas.  Do not douche or use tampons or scented sanitary pads.  Do not cross your legs for long periods of time.  Avoid cat litter boxes and soil used by cats. These carry germs that can cause birth defects in the baby and possibly loss of the fetus by miscarriage or stillbirth.  Avoid all smoking, herbs, alcohol, and medicines not prescribed by  your health care provider. Chemicals in these affect the formation and growth of the baby.  Do not use any tobacco products, including cigarettes, chewing tobacco, and electronic cigarettes. If you need help quitting, ask your health care provider. You may receive counseling support and other resources to help you quit.  Schedule a dentist appointment. At home, brush your teeth with a soft toothbrush and be gentle when you floss. SEEK MEDICAL CARE IF:   You have dizziness.  You have mild pelvic cramps, pelvic pressure, or nagging pain in the abdominal area.  You have persistent nausea, vomiting, or diarrhea.  You have a bad smelling vaginal discharge.  You have pain with urination.  You notice increased swelling in your face, hands, legs, or ankles. SEEK IMMEDIATE MEDICAL CARE IF:   You have a fever.  You are leaking fluid from your vagina.  You have spotting or bleeding from your vagina.  You have severe abdominal cramping or pain.  You have rapid weight gain or loss.  You vomit blood or material that looks like coffee grounds.  You are exposed to [place] measles and have never had them.  You are exposed to fifth disease or chickenpox.  You develop a severe headache.  You have shortness of breath.  You have any kind of trauma, such as from a fall or a car accident.   This information is not intended to replace advice given to you by your health care provider. Make sure you discuss any questions you have with your health care provider.   Document Released: 04/23/2001 Document Revised: 05/20/2014 Document Reviewed: 03/09/2013 Elsevier Interactive Patient Education 2016 [employer].   Prenatal Care Providers [facility]    [facility]  [phone]     Phone: [phone]          [facility]                      [facility]   [facility]     Phone: [phone]  Phone: [phone]  Center for  [facility]             @ [place]            Phone: [phone]         [facility]     Phone: [phone]  Phone: [phone]           [facility] @ [place]                hone: [phone]  Phone: [phone]         [facility]      Phone: [phone]  Phone: [phone]         [facility]                Phone: [phone]  [facility]   [phone]                 [facility]          Phone: [phone] [facility]   Phone: [phone]

## 2015-04-13 ENCOUNTER — Inpatient Hospital Stay (HOSPITAL_COMMUNITY)
Admission: AD | Admit: 2015-04-13 | Discharge: 2015-04-13 | Disposition: A | Discharge status: Home / Self Care | Payer: Medicaid Other | Source: Ambulatory Visit | Attending: Family Medicine | Admitting: Family Medicine

## 2015-04-13 ENCOUNTER — Encounter (HOSPITAL_COMMUNITY): Payer: Self-pay

## 2015-04-13 DIAGNOSIS — N760 Acute vaginitis: Secondary | ICD-10-CM | POA: Diagnosis not present

## 2015-04-13 DIAGNOSIS — O23591 Infection of other part of genital tract in pregnancy, first trimester: Secondary | ICD-10-CM | POA: Diagnosis not present

## 2015-04-13 DIAGNOSIS — O99331 Smoking (tobacco) complicating pregnancy, first trimester: Secondary | ICD-10-CM | POA: Insufficient documentation

## 2015-04-13 DIAGNOSIS — A499 Bacterial infection, unspecified: Secondary | ICD-10-CM

## 2015-04-13 DIAGNOSIS — N898 Other specified noninflammatory disorders of vagina: Secondary | ICD-10-CM | POA: Diagnosis present

## 2015-04-13 DIAGNOSIS — B9689 Other specified bacterial agents as the cause of diseases classified elsewhere: Secondary | ICD-10-CM | POA: Diagnosis not present

## 2015-04-13 DIAGNOSIS — Z3A11 11 weeks gestation of pregnancy: Secondary | ICD-10-CM

## 2015-04-13 LAB — WET PREP, GENITAL
Sperm: NONE SEEN
TRICH WET PREP: NONE SEEN
Yeast Wet Prep HPF POC: NONE SEEN

## 2015-04-13 LAB — URINALYSIS, ROUTINE W REFLEX MICROSCOPIC
Bilirubin Urine: NEGATIVE
Glucose, UA: NEGATIVE mg/dL
Hgb urine dipstick: NEGATIVE
KETONES UR: NEGATIVE mg/dL
LEUKOCYTES UA: NEGATIVE
NITRITE: NEGATIVE
PROTEIN: NEGATIVE mg/dL
Specific Gravity, Urine: 1.025 (ref 1.005–1.030)
pH: 6 (ref 5.0–8.0)

## 2015-04-13 MED ORDER — METRONIDAZOLE 500 MG PO TABS: 500.0000 mg | ORAL_TABLET | Freq: Two times a day (BID) | ORAL | Status: DC

## 2015-04-13 NOTE — MAU Provider Note (Signed)
History     CSN: [id number]  Arrival date and time: 04/13/15 1944   First Provider Initiated Contact with Patient 04/13/15 2017      Chief Complaint  Patient presents with  . Vaginal Discharge  . Abdominal Cramping   HPI Comments: Samantha [name] is a 29 y.o. [id number] at 1547w1d who presents today with vaginal discharge. She states that it seems like BV. She has had BV in the past, and it has the same odor.   Vaginal Discharge The patient's primary symptoms include vaginal discharge. This is a new problem. The current episode started yesterday. The problem occurs constantly. The problem has been unchanged. The pain is moderate (6/10 ). The problem affects both sides. She is pregnant. Associated symptoms include abdominal pain and nausea. Pertinent negatives include no chills, constipation, diarrhea, dysuria, fever, frequency, urgency or vomiting. The vaginal discharge was copious, thick and white. There has been no bleeding. Nothing aggravates the symptoms. She has tried nothing for the symptoms. She is sexually active. She uses nothing for contraception.    Past Medical History  Diagnosis Date  . History of gestational diabetes     Past Surgical History  Procedure Laterality Date  . Tonsills remove    . Fallopian tube removal    . Mouth surgery      Family History  Problem Relation Age of Onset  . Osteoporosis Mother   . COPD Father   . COPD Paternal Grandmother     Social History  Substance Use Topics  . Smoking status: Current Some Day Smoker  . Smokeless tobacco: None  . Alcohol Use: No    Allergies: No Known Allergies  Prescriptions prior to admission  Medication Sig Dispense Refill Last Dose  . Prenatal Vit-Min-FA-Fish Oil ([facility] PRENATAL GUMMY PO) Take 2 capsules by mouth daily.   02/25/2015 at Unknown time    Review of Systems  Constitutional: Negative for fever and chills.  Gastrointestinal: Positive for nausea and abdominal pain. Negative for vomiting,  diarrhea and constipation.  Genitourinary: Positive for vaginal discharge. Negative for dysuria, urgency and frequency.   Physical Exam   Blood pressure 115/79, pulse 91, temperature 97.8 F (36.6 C), temperature source Oral, resp. rate 18, height 5\' 4"  (1.626 m), weight 44.725 kg (98 lb 9.6 oz), last menstrual period 01/25/2015, SpO2 100 %.  Physical Exam  Nursing note and vitals reviewed. Constitutional: She is oriented to person, place, and time. She appears well-developed and well-nourished. No distress.  HENT:  Head: Normocephalic.  Cardiovascular: Normal rate.   Respiratory: Effort normal.  GI: Soft. There is no tenderness. There is no rebound.  Genitourinary:   External: no lesion Vagina: small amount of white discharge Cervix: pink, smooth, firm/thick no CMT Uterus: 11 weeks size. FHT 169 with doppler  Adnexa: NT   Neurological: She is alert and oriented to person, place, and time.  Skin: Skin is warm and dry.  Psychiatric: She has a normal mood and affect.   Results for orders placed or performed during the hospital encounter of 04/13/15 (from the past 24 hour(s))  Urinalysis, Routine w reflex microscopic ([facility])     Status: None   Collection Time: 04/13/15  7:55 PM  Result Value Ref Range   Color, Urine YELLOW YELLOW   APPearance CLEAR CLEAR   Specific Gravity, Urine 1.025 1.005 - 1.030   pH 6.0 5.0 - 8.0   Glucose, UA NEGATIVE NEGATIVE mg/dL   Hgb urine dipstick NEGATIVE NEGATIVE   Bilirubin  Urine NEGATIVE NEGATIVE   Ketones, ur NEGATIVE NEGATIVE mg/dL   Protein, ur NEGATIVE NEGATIVE mg/dL   Nitrite NEGATIVE NEGATIVE   Leukocytes, UA NEGATIVE NEGATIVE  Wet prep, genital     Status: Abnormal   Collection Time: 04/13/15  8:20 PM  Result Value Ref Range   Yeast Wet Prep HPF POC NONE SEEN NONE SEEN   Trich, Wet Prep NONE SEEN NONE SEEN   Clue Cells Wet Prep HPF POC PRESENT (A) NONE SEEN   WBC, Wet Prep HPF POC MODERATE (A) NONE SEEN   Sperm NONE SEEN      MAU Course  Procedures  MDM  Assessment and Plan   1. BV (bacterial vaginosis)   2. [redacted] weeks gestation of pregnancy    DC home Comfort measures reviewed  1stTrimester precautions  RX: flagyl  BID x 7 days  Return to MAU as needed FU with OB as planned  Follow-up Information    Follow up with [facility].   Specialty:  Obstetrics and Gynecology   Why:  As scheduled   Contact information:   [address] [place] [address] [phone]      Tawnya Crook 04/13/2015, 8:17 PM

## 2015-04-13 NOTE — MAU Note (Signed)
C/o of vaginal discharge and cramping that started yesterday. States discharge is excessive, white and thick that has an odor-denies itching. Denies vaginal bleeding. Rates pain 6/10. Denies urinary s/s. Denies n/v/d or constipation today.

## 2015-04-13 NOTE — Discharge Instructions (Signed)

## 2015-04-14 LAB — OB RESULTS CONSOLE ANTIBODY SCREEN: ANTIBODY SCREEN: NEGATIVE

## 2015-04-14 LAB — GC/CHLAMYDIA PROBE AMP (~~LOC~~) NOT AT ARMC
CHLAMYDIA, DNA PROBE: NEGATIVE
NEISSERIA GONORRHEA: NEGATIVE

## 2015-04-14 LAB — OB RESULTS CONSOLE ABO/RH: RH TYPE: POSITIVE

## 2015-04-14 LAB — OB RESULTS CONSOLE RPR: RPR: NONREACTIVE

## 2015-04-14 LAB — OB RESULTS CONSOLE RUBELLA ANTIBODY, IGM: RUBELLA: IMMUNE

## 2015-04-14 LAB — OB RESULTS CONSOLE HIV ANTIBODY (ROUTINE TESTING): HIV: NONREACTIVE

## 2015-04-14 LAB — OB RESULTS CONSOLE HEPATITIS B SURFACE ANTIGEN: HEP B S AG: NEGATIVE

## 2015-04-21 ENCOUNTER — Inpatient Hospital Stay (HOSPITAL_COMMUNITY)
Admission: AD | Admit: 2015-04-21 | Discharge: 2015-04-21 | Disposition: A | Discharge status: Home / Self Care | Payer: Medicaid Other | Source: Ambulatory Visit | Attending: Obstetrics and Gynecology | Admitting: Obstetrics and Gynecology

## 2015-04-21 ENCOUNTER — Encounter (HOSPITAL_COMMUNITY): Payer: Self-pay | Admitting: *Deleted

## 2015-04-21 DIAGNOSIS — E860 Dehydration: Secondary | ICD-10-CM | POA: Insufficient documentation

## 2015-04-21 DIAGNOSIS — O99341 Other mental disorders complicating pregnancy, first trimester: Secondary | ICD-10-CM | POA: Diagnosis not present

## 2015-04-21 DIAGNOSIS — F410 Panic disorder [episodic paroxysmal anxiety] without agoraphobia: Secondary | ICD-10-CM | POA: Diagnosis not present

## 2015-04-21 DIAGNOSIS — F32A Depression, unspecified: Secondary | ICD-10-CM | POA: Diagnosis present

## 2015-04-21 DIAGNOSIS — F418 Other specified anxiety disorders: Secondary | ICD-10-CM | POA: Diagnosis not present

## 2015-04-21 DIAGNOSIS — R109 Unspecified abdominal pain: Secondary | ICD-10-CM | POA: Diagnosis present

## 2015-04-21 DIAGNOSIS — F17210 Nicotine dependence, cigarettes, uncomplicated: Secondary | ICD-10-CM | POA: Insufficient documentation

## 2015-04-21 DIAGNOSIS — F419 Anxiety disorder, unspecified: Secondary | ICD-10-CM | POA: Diagnosis present

## 2015-04-21 DIAGNOSIS — G4700 Insomnia, unspecified: Secondary | ICD-10-CM | POA: Diagnosis not present

## 2015-04-21 DIAGNOSIS — Z8632 Personal history of gestational diabetes: Secondary | ICD-10-CM | POA: Diagnosis not present

## 2015-04-21 DIAGNOSIS — F329 Major depressive disorder, single episode, unspecified: Secondary | ICD-10-CM | POA: Insufficient documentation

## 2015-04-21 DIAGNOSIS — Z3A12 12 weeks gestation of pregnancy: Secondary | ICD-10-CM | POA: Diagnosis not present

## 2015-04-21 DIAGNOSIS — O99331 Smoking (tobacco) complicating pregnancy, first trimester: Secondary | ICD-10-CM | POA: Insufficient documentation

## 2015-04-21 DIAGNOSIS — F17200 Nicotine dependence, unspecified, uncomplicated: Secondary | ICD-10-CM

## 2015-04-21 LAB — URINALYSIS, ROUTINE W REFLEX MICROSCOPIC
Bilirubin Urine: NEGATIVE
Glucose, UA: NEGATIVE mg/dL
Hgb urine dipstick: NEGATIVE
Ketones, ur: >80 mg/dL — AB
Leukocytes, UA: NEGATIVE
Nitrite: NEGATIVE
Protein, ur: NEGATIVE mg/dL
Specific Gravity, Urine: 1.025 (ref 1.005–1.030)
pH: 6.5 (ref 5.0–8.0)

## 2015-04-21 MED ORDER — SERTRALINE HCL 50 MG PO TABS: 50.0000 mg | ORAL_TABLET | Freq: Every day | ORAL | Status: DC

## 2015-04-21 MED ORDER — HYDROXYZINE HCL 50 MG PO TABS
50.0000 mg | ORAL_TABLET | ORAL | Status: AC
  Administered 2015-04-21: 50 mg via ORAL
  Filled 2015-04-21: qty 1

## 2015-04-21 MED ORDER — HYDROXYZINE PAMOATE 50 MG PO CAPS: 50.0000 mg | ORAL_CAPSULE | Freq: Three times a day (TID) | ORAL | Status: DC | PRN

## 2015-04-21 MED ORDER — SERTRALINE HCL 50 MG PO TABS
50.0000 mg | ORAL_TABLET | Freq: Once | ORAL | Status: AC
  Administered 2015-04-21: 50 mg via ORAL
  Filled 2015-04-21: qty 1

## 2015-04-21 NOTE — MAU Note (Signed)
Has not slept all week, having lower abdominal pain and back pain, having panic attacks (has history). Has been diagnosed with BV taking Flagyl.

## 2015-04-21 NOTE — Discharge Instructions (Signed)
Generalized Anxiety Disorder °Generalized anxiety disorder (GAD) is a mental disorder. It interferes with life functions, including relationships, work, and school. °GAD is different from normal anxiety, which everyone experiences at some point in their lives in response to specific life events and activities. Normal anxiety actually helps us prepare for and get through these life events and activities. Normal anxiety goes away after the event or activity is over.  °GAD causes anxiety that is not necessarily related to specific events or activities. It also causes excess anxiety in proportion to specific events or activities. The anxiety associated with GAD is also difficult to control. GAD can vary from mild to severe. People with severe GAD can have intense waves of anxiety with physical symptoms (panic attacks).  °SYMPTOMS °The anxiety and worry associated with GAD are difficult to control. This anxiety and worry are related to many life events and activities and also occur more days than not for 6 months or longer. People with GAD also have three or more of the following symptoms (one or more in children): °· Restlessness.   °· Fatigue. °· Difficulty concentrating.   °· Irritability. °· Muscle tension. °· Difficulty sleeping or unsatisfying sleep. °DIAGNOSIS °GAD is diagnosed through an assessment by your health care provider. Your health care provider will ask you questions about your mood, physical symptoms, and events in your life. Your health care provider may ask you about your medical history and use of alcohol or drugs, including prescription medicines. Your health care provider may also do a physical exam and blood tests. Certain medical conditions and the use of certain substances can cause symptoms similar to those associated with GAD. Your health care provider may refer you to a mental health specialist for further evaluation. °TREATMENT °The following therapies are usually used to treat GAD:   °· Medication. Antidepressant medication usually is prescribed for long-term daily control. Antianxiety medicines may be added in severe cases, especially when panic attacks occur.   °· Talk therapy (psychotherapy). Certain types of talk therapy can be helpful in treating GAD by providing support, education, and guidance. A form of talk therapy called cognitive behavioral therapy can teach you healthy ways to think about and react to daily life events and activities. °· Stress management techniques. These include yoga, meditation, and exercise and can be very helpful when they are practiced regularly. °A mental health specialist can help determine which treatment is best for you. Some people see improvement with one therapy. However, other people require a combination of therapies. °  °This information is not intended to replace advice given to you by your health care provider. Make sure you discuss any questions you have with your health care provider. °  °Document Released: 08/24/2012 Document Revised: 05/20/2014 Document Reviewed: 08/24/2012 °Elsevier Interactive Patient Education ©2016 Elsevier Inc. ° °Insomnia °Insomnia is a sleep disorder that makes it difficult to fall asleep or to stay asleep. Insomnia can cause tiredness (fatigue), low energy, difficulty concentrating, mood swings, and poor performance at work or school.  °There are three different ways to classify insomnia:  °· Difficulty falling asleep. °· Difficulty staying asleep. °· Waking up too early in the morning. °Any type of insomnia can be long-term (chronic) or short-term (acute). Both are common. Short-term insomnia usually lasts for three months or less. Chronic insomnia occurs at least three times a week for longer than three months. °CAUSES  °Insomnia may be caused by another condition, situation, or substance, such as: °· Anxiety. °· Certain medicines. °· Gastroesophageal reflux disease (GERD) or other gastrointestinal    conditions. °· Asthma or other breathing conditions. °· Restless legs syndrome, sleep apnea, or other sleep disorders. °· Chronic pain. °· Menopause. This may include hot flashes. °· Stroke. °· Abuse of alcohol, tobacco, or illegal drugs. °· Depression. °· Caffeine.   °· Neurological disorders, such as Alzheimer disease. °· An overactive thyroid (hyperthyroidism). °The cause of insomnia may not be known. °RISK FACTORS °Risk factors for insomnia include: °· Gender. Women are more commonly affected than men. °· Age. Insomnia is more common as you get older. °· Stress. This may involve your professional or personal life. °· Income. Insomnia is more common in people with lower income. °· Lack of exercise.   °· Irregular work schedule or night shifts. °· Traveling between different time zones. °SIGNS AND SYMPTOMS °If you have insomnia, trouble falling asleep or trouble staying asleep is the main symptom. This may lead to other symptoms, such as: °· Feeling fatigued. °· Feeling nervous about going to sleep. °· Not feeling rested in the morning. °· Having trouble concentrating. °· Feeling irritable, anxious, or depressed. °TREATMENT  °Treatment for insomnia depends on the cause. If your insomnia is caused by an underlying condition, treatment will focus on addressing the condition. Treatment may also include:  °· Medicines to help you sleep. °· Counseling or therapy. °· Lifestyle adjustments. °HOME CARE INSTRUCTIONS  °· Take medicines only as directed by your health care provider. °· Keep regular sleeping and waking hours. Avoid naps. °· Keep a sleep diary to help you and your health care provider figure out what could be causing your insomnia. Include:   °¨ When you sleep. °¨ When you wake up during the night. °¨ How well you sleep.   °¨ How rested you feel the next day. °¨ Any side effects of medicines you are taking. °¨ What you eat and drink.   °· Make your bedroom a comfortable place where it is easy to fall  asleep: °¨ Put up shades or special blackout curtains to block light from outside. °¨ Use a white noise machine to block noise. °¨ Keep the temperature cool.   °· Exercise regularly as directed by your health care provider. Avoid exercising right before bedtime. °· Use relaxation techniques to manage stress. Ask your health care provider to suggest some techniques that may work well for you. These may include: °¨ Breathing exercises. °¨ Routines to release muscle tension. °¨ Visualizing peaceful scenes. °· Cut back on alcohol, caffeinated beverages, and cigarettes, especially close to bedtime. These can disrupt your sleep. °· Do not overeat or eat spicy foods right before bedtime. This can lead to digestive discomfort that can make it hard for you to sleep. °· Limit screen use before bedtime. This includes: °¨ Watching TV. °¨ Using your smartphone, tablet, and computer. °· Stick to a routine. This can help you fall asleep faster. Try to do a quiet activity, brush your teeth, and go to bed at the same time each night. °· Get out of bed if you are still awake after 15 minutes of trying to sleep. Keep the lights down, but try reading or doing a quiet activity. When you feel sleepy, go back to bed. °· Make sure that you drive carefully. Avoid driving if you feel very sleepy. °· Keep all follow-up appointments as directed by your health care provider. This is important. °SEEK MEDICAL CARE IF:  °· You are tired throughout the day or have trouble in your daily routine due to sleepiness. °· You continue to have sleep problems or your sleep problems   get worse. °SEEK IMMEDIATE MEDICAL CARE IF:  °· You have serious thoughts about hurting yourself or someone else. °  °This information is not intended to replace advice given to you by your health care provider. Make sure you discuss any questions you have with your health care provider. °  °Document Released: 04/26/2000 Document Revised: 01/18/2015 Document Reviewed:  01/28/2014 °Elsevier Interactive Patient Education ©2016 Elsevier Inc. ° °

## 2015-04-21 NOTE — MAU Provider Note (Signed)
History   29 yo G4P3003 at 12 2/7 weeks presented unannounced c/o cramping since taking Flagyl course for BV this week, severe insomnia, and more frequent panic attacks.  Also c/o dysuria--denies fever.  Denies SI/HI.  Patient has hx of panic and depression, with remote hx of med use.  "Fewer since pregnant", but several in the last 24 hours, without obvious precipitating factor.  Seen for NOB at [facility] on 12/7, but no mention of these issues at that visit.  Cultures negative 04/14/15.    Reports minimal sleep in the last week.  No current therapist.  Patient Active Problem List   Diagnosis Date Noted  . Panic attacks 04/21/2015  . History of gestational diabetes 04/21/2015  . Depression 04/21/2015  . Anxiety 04/21/2015  . Smoker 04/21/2015    Chief Complaint  Patient presents with  . Abdominal Pain  . Panic Attack  . Insomnia   HPI:  See above  OB History    Gravida Para Term Preterm AB TAB SAB Ectopic Multiple Living   4 3 3       3       Past Medical History  Diagnosis Date  . History of gestational diabetes     Past Surgical History  Procedure Laterality Date  . Tonsills remove    . Fallopian tube removal    . Mouth surgery      Family History  Problem Relation Age of Onset  . Osteoporosis Mother   . COPD Father   . COPD Paternal Grandmother     Social History  Substance Use Topics  . Smoking status: Current Some Day Smoker  . Smokeless tobacco: Not on file  . Alcohol Use: No    Allergies: No Known Allergies  Prescriptions prior to admission  Medication Sig Dispense Refill Last Dose  . calcium carbonate (TUMS - DOSED IN MG ELEMENTAL CALCIUM) 500 MG chewable tablet Chew 2 tablets by mouth daily as needed for indigestion or heartburn.   Past Week at Unknown time  . Diphenhydramine-APAP, sleep, (TYLENOL PM EXTRA STRENGTH) 50-1000 MG/30ML LIQD Take 30 mLs by mouth daily as needed (sleep and cold).   Past Week at Unknown time  . Docusate Calcium (STOOL  SOFTENER PO) Take 1 tablet by mouth daily as needed (constipastion).   04/20/2015 at Unknown time  . ibuprofen (ADVIL,MOTRIN) 200 MG tablet Take 400 mg by mouth every 6 (six) hours as needed for mild pain.   04/21/2015 at Unknown time  . metroNIDAZOLE (FLAGYL) 500 MG tablet Take 1 tablet (500 mg total) by mouth 2 (two) times daily. 14 tablet 0 04/20/2015 at Unknown time  . Prenatal Vit-Fe Fumarate-FA (PRENATAL MULTIVITAMIN) TABS tablet Take 1 tablet by mouth daily at 12 noon.   04/19/2015    ROS:  Shaking with anxiety, lower abdominal cramping, back pain, dysuria, insomnia Physical Exam   Blood pressure 136/84, pulse 107, temperature 98 F (36.7 C), resp. rate 16, height 5' 5.5" (1.664 m), weight 44.566 kg (98 lb 4 oz), last menstrual period 01/25/2015.    Physical Exam  Thin, pale, shaking with anxiety on arrival, rapid speech--now more calm after time of talking with this Aadit Hagood. Chest clear Heart RRR, pulse 100 Abd gravid, NT, fundal height 12-13 weeks. No CVAT. Pelvic--deferred at present, no d/c. Ext WNL  FHR 171 UCs none palpated.  Results for orders placed or performed during the hospital encounter of 04/21/15 (from the past 24 hour(s))  Urinalysis, Routine w reflex microscopic (not at [facility])  Status: Abnormal   Collection Time: 04/21/15  3:35 PM  Result Value Ref Range   Color, Urine YELLOW YELLOW   APPearance CLEAR CLEAR   Specific Gravity, Urine 1.025 1.005 - 1.030   pH 6.5 5.0 - 8.0   Glucose, UA NEGATIVE NEGATIVE mg/dL   Hgb urine dipstick NEGATIVE NEGATIVE   Bilirubin Urine NEGATIVE NEGATIVE   Ketones, ur >80 (A) NEGATIVE mg/dL   Protein, ur NEGATIVE NEGATIVE mg/dL   Nitrite NEGATIVE NEGATIVE   Leukocytes, UA NEGATIVE NEGATIVE    ED Course  Assessment: IUP at 12 1/7 weeks Panic disorder, with depression Insomnia Dehydration   Plan: Long discussion with patient regarding issues, with support for treating these issues with medication. Patient agreeable  with that plan, and also agrees with referral for counseling to [facility]. Rx Zoloft 50 mg po daily, #30, 4 refills Rx Vistaril 50 mg po TID prn, #90, 2 refills Single dose of Zoloft 50 mg and Vistaril 50 mg now. Will have [facility] office staff contact patient to process referral to [facility] ASAP. Reassurance to patient regarding safety of baby. Encouraged better hydration. Keep scheduled appt at [facility] (05/16/14).   Nigel Bridgeman CNM, MSN 04/21/2015 4:35 PM

## 2015-04-26 LAB — OB RESULTS CONSOLE GC/CHLAMYDIA
Chlamydia: NEGATIVE
Gonorrhea: NEGATIVE

## 2015-05-14 NOTE — L&D Delivery Note (Signed)
Delivery Note At 2:02 PM a viable female was delivered via Vaginal, Spontaneous Delivery (Presentation: ;  ).  APGAR: 9, 10; weight  .   Placenta status: Intact, Spontaneous.  Cord: 3 vessels with the following complications: .  Cord pH: not sent  Anesthesia: Epidural  Episiotomy: None Lacerations: None Suture Repair: none needed Est. Blood Loss (mL): 100  Mom to postpartum.  Baby to Couplet care / Skin to Skin.  Abaigeal Moomaw A 10/18/2015, 2:21 PM

## 2015-06-02 ENCOUNTER — Encounter (HOSPITAL_COMMUNITY): Payer: Self-pay

## 2015-06-02 ENCOUNTER — Emergency Department (HOSPITAL_COMMUNITY)
Admission: EM | Admit: 2015-06-02 | Discharge: 2015-06-02 | Disposition: A | Discharge status: Home / Self Care | Payer: No Typology Code available for payment source | Attending: Emergency Medicine | Admitting: Emergency Medicine

## 2015-06-02 ENCOUNTER — Emergency Department (HOSPITAL_COMMUNITY): Payer: No Typology Code available for payment source

## 2015-06-02 DIAGNOSIS — Y92410 Unspecified street and highway as the place of occurrence of the external cause: Secondary | ICD-10-CM | POA: Diagnosis not present

## 2015-06-02 DIAGNOSIS — Y9389 Activity, other specified: Secondary | ICD-10-CM | POA: Diagnosis not present

## 2015-06-02 DIAGNOSIS — Y998 Other external cause status: Secondary | ICD-10-CM | POA: Diagnosis not present

## 2015-06-02 DIAGNOSIS — Z8632 Personal history of gestational diabetes: Secondary | ICD-10-CM | POA: Insufficient documentation

## 2015-06-02 DIAGNOSIS — F17200 Nicotine dependence, unspecified, uncomplicated: Secondary | ICD-10-CM | POA: Diagnosis not present

## 2015-06-02 DIAGNOSIS — S93401A Sprain of unspecified ligament of right ankle, initial encounter: Secondary | ICD-10-CM

## 2015-06-02 DIAGNOSIS — S99911A Unspecified injury of right ankle, initial encounter: Secondary | ICD-10-CM | POA: Diagnosis present

## 2015-06-02 DIAGNOSIS — Z79899 Other long term (current) drug therapy: Secondary | ICD-10-CM | POA: Insufficient documentation

## 2015-06-02 LAB — COMPREHENSIVE METABOLIC PANEL
ALT: 79 U/L — ABNORMAL HIGH (ref 14–54)
AST: 44 U/L — AB (ref 15–41)
Albumin: 3.3 g/dL — ABNORMAL LOW (ref 3.5–5.0)
Alkaline Phosphatase: 47 U/L (ref 38–126)
Anion gap: 13 (ref 5–15)
BILIRUBIN TOTAL: 0.5 mg/dL (ref 0.3–1.2)
BUN: 9 mg/dL (ref 6–20)
CO2: 23 mmol/L (ref 22–32)
CREATININE: 0.48 mg/dL (ref 0.44–1.00)
Calcium: 9.3 mg/dL (ref 8.9–10.3)
Chloride: 101 mmol/L (ref 101–111)
GFR calc Af Amer: >60 mL/min (ref >60)
Glucose, Bld: 69 mg/dL (ref 65–99)
POTASSIUM: 3.4 mmol/L — AB (ref 3.5–5.1)
Sodium: 137 mmol/L (ref 135–145)
TOTAL PROTEIN: 6.2 g/dL — AB (ref 6.5–8.1)

## 2015-06-02 LAB — CBC WITH DIFFERENTIAL/PLATELET
BASOS ABS: 0 10*3/uL (ref 0.0–0.1)
BASOS PCT: 0 %
EOS ABS: 0 10*3/uL (ref 0.0–0.7)
EOS PCT: 0 %
HCT: 33.1 % — ABNORMAL LOW (ref 36.0–46.0)
Hemoglobin: 11.4 g/dL — ABNORMAL LOW (ref 12.0–15.0)
Lymphocytes Relative: 18 %
Lymphs Abs: 1.9 10*3/uL (ref 0.7–4.0)
MCH: 32.9 pg (ref 26.0–34.0)
MCHC: 34.4 g/dL (ref 30.0–36.0)
MCV: 95.4 fL (ref 78.0–100.0)
MONO ABS: 0.5 10*3/uL (ref 0.1–1.0)
Monocytes Relative: 5 %
NEUTROS ABS: 8.2 10*3/uL — AB (ref 1.7–7.7)
Neutrophils Relative %: 77 %
Platelets: 181 10*3/uL (ref 150–400)
RBC: 3.47 MIL/uL — ABNORMAL LOW (ref 3.87–5.11)
RDW: 13.2 % (ref 11.5–15.5)
WBC: 10.6 10*3/uL — ABNORMAL HIGH (ref 4.0–10.5)

## 2015-06-02 LAB — URINALYSIS, ROUTINE W REFLEX MICROSCOPIC
Bilirubin Urine: NEGATIVE
Glucose, UA: NEGATIVE mg/dL
Hgb urine dipstick: NEGATIVE
Ketones, ur: 15 mg/dL — AB
Leukocytes, UA: NEGATIVE
Nitrite: NEGATIVE
PH: 6 (ref 5.0–8.0)
Protein, ur: NEGATIVE mg/dL
SPECIFIC GRAVITY, URINE: 1.009 (ref 1.005–1.030)

## 2015-06-02 LAB — LIPASE, BLOOD: LIPASE: 25 U/L (ref 11–51)
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
## 2015-06-02 NOTE — ED Notes (Signed)
Pt is in stable condition upon d/c and is escorted from ED via wheelchair. 

## 2015-06-02 NOTE — Discharge Instructions (Signed)
Your liver enzymes were slightly elevated. It is very important for you to follow up with your OBGYN for repeat labs.  Ankle Sprain An ankle sprain is an injury to the strong, fibrous tissues (ligaments) that hold the bones of your ankle joint together.  CAUSES An ankle sprain is usually caused by a fall or by twisting your ankle. Ankle sprains most commonly occur when you step on the outer edge of your foot, and your ankle turns inward. People who participate in sports are more prone to these types of injuries.  SYMPTOMS   Pain in your ankle. The pain may be present at rest or only when you are trying to stand or walk.  Swelling.  Bruising. Bruising may develop immediately or within 1 to 2 days after your injury.  Difficulty standing or walking, particularly when turning corners or changing directions. DIAGNOSIS  Your caregiver will ask you details about your injury and perform a physical exam of your ankle to determine if you have an ankle sprain. During the physical exam, your caregiver will press on and apply pressure to specific areas of your foot and ankle. Your caregiver will try to move your ankle in certain ways. An X-ray exam may be done to be sure a bone was not broken or a ligament did not separate from one of the bones in your ankle (avulsion fracture).  TREATMENT  Certain types of braces can help stabilize your ankle. Your caregiver can make a recommendation for this. Your caregiver may recommend the use of medicine for pain. If your sprain is severe, your caregiver may refer you to a surgeon who helps to restore function to parts of your skeletal system (orthopedist) or a physical therapist. HOME CARE INSTRUCTIONS   Apply ice to your injury for 1-2 days or as directed by your caregiver. Applying ice helps to reduce inflammation and pain.  Put ice in a plastic bag.  Place a towel between your skin and the bag.  Leave the ice on for 15-20 minutes at a time, every 2 hours while  you are awake.  Only take over-the-counter or prescription medicines for pain, discomfort, or fever as directed by your caregiver.  Elevate your injured ankle above the level of your heart as much as possible for 2-3 days.  If your caregiver recommends crutches, use them as instructed. Gradually put weight on the affected ankle. Continue to use crutches or a cane until you can walk without feeling pain in your ankle.  If you have a plaster splint, wear the splint as directed by your caregiver. Do not rest it on anything harder than a pillow for the first 24 hours. Do not put weight on it. Do not get it wet. You may take it off to take a shower or bath.  You may have been given an elastic bandage to wear around your ankle to provide support. If the elastic bandage is too tight (you have numbness or tingling in your foot or your foot becomes cold and blue), adjust the bandage to make it comfortable.  If you have an air splint, you may blow more air into it or let air out to make it more comfortable. You may take your splint off at night and before taking a shower or bath. Wiggle your toes in the splint several times per day to decrease swelling. SEEK MEDICAL CARE IF:   You have rapidly increasing bruising or swelling.  Your toes feel extremely cold or you lose feeling in  your foot.  Your pain is not relieved with medicine. SEEK IMMEDIATE MEDICAL CARE IF:  Your toes are numb or blue.  You have severe pain that is increasing. MAKE SURE YOU:   Understand these instructions.  Will watch your condition.  Will get help right away if you are not doing well or get worse.   This information is not intended to replace advice given to you by your health care provider. Make sure you discuss any questions you have with your health care provider.   Document Released: 04/29/2005 Document Revised: 05/20/2014 Document Reviewed: 05/11/2011 Elsevier Interactive Patient Education [employer].

## 2015-06-02 NOTE — ED Notes (Signed)
Patient transported to X-ray 

## 2015-09-24 ENCOUNTER — Encounter (HOSPITAL_COMMUNITY): Payer: Self-pay | Admitting: *Deleted

## 2015-09-24 ENCOUNTER — Inpatient Hospital Stay (HOSPITAL_COMMUNITY)
Admission: AD | Admit: 2015-09-24 | Discharge: 2015-09-24 | Disposition: A | Discharge status: Home / Self Care | Payer: Medicaid Other | Source: Ambulatory Visit | Attending: Obstetrics and Gynecology | Admitting: Obstetrics and Gynecology

## 2015-09-24 DIAGNOSIS — O26893 Other specified pregnancy related conditions, third trimester: Secondary | ICD-10-CM | POA: Diagnosis present

## 2015-09-24 DIAGNOSIS — Z3A34 34 weeks gestation of pregnancy: Secondary | ICD-10-CM | POA: Insufficient documentation

## 2015-09-24 DIAGNOSIS — O99333 Smoking (tobacco) complicating pregnancy, third trimester: Secondary | ICD-10-CM | POA: Diagnosis not present

## 2015-09-24 DIAGNOSIS — O4703 False labor before 37 completed weeks of gestation, third trimester: Secondary | ICD-10-CM

## 2015-09-24 DIAGNOSIS — Z3A35 35 weeks gestation of pregnancy: Secondary | ICD-10-CM

## 2015-09-24 LAB — URINALYSIS, ROUTINE W REFLEX MICROSCOPIC
BILIRUBIN URINE: NEGATIVE
Glucose, UA: NEGATIVE mg/dL
Hgb urine dipstick: NEGATIVE
Ketones, ur: 15 mg/dL — AB
Leukocytes, UA: NEGATIVE
NITRITE: NEGATIVE
Protein, ur: NEGATIVE mg/dL
SPECIFIC GRAVITY, URINE: 1.01 (ref 1.005–1.030)
pH: 6 (ref 5.0–8.0)

## 2015-09-24 LAB — AMNISURE RUPTURE OF MEMBRANE (ROM) NOT AT ARMC: AMNISURE: NEGATIVE

## 2015-09-24 LAB — POCT FERN TEST

## 2015-09-24 MED ORDER — NIFEDIPINE 10 MG PO CAPS: 10.0000 mg | ORAL_CAPSULE | Freq: Once | ORAL | Status: DC

## 2015-09-24 MED ORDER — NIFEDIPINE 10 MG PO CAPS: 20.0000 mg | ORAL_CAPSULE | Freq: Three times a day (TID) | ORAL | Status: DC

## 2015-09-24 NOTE — MAU Note (Signed)
Pt presents to MAU with complaints of leakage of fluid since last night. Reports contractions that are 5 mins apart.

## 2015-09-24 NOTE — Discharge Instructions (Signed)

## 2015-09-24 NOTE — MAU Provider Note (Signed)
History     CSN: [id number]  Arrival date and time: 09/24/15 1656   First Provider Initiated Contact with Patient 09/24/15 1732      Chief Complaint  Patient presents with  . Rupture of Membranes   HPI Comments: ERICHA [name] is a 30 y.o. [phone] at [redacted]w[redacted]d presenting with concern for leakage of fluid and contractions. Felt wetness but no gush or need to wear peripad. The contractions are more intense than UCs she has perceived intermittently for the past few weeks. No vaginal bleeding. Good FM.     OB History  Gravida Para Term Preterm AB SAB TAB Ectopic Multiple Living  # Outcome Date GA Lbr Len/2nd Weight Sex Delivery Anes PTL Lv  4 Current           3 Term      Vag-Spont     2 Term      Vag-Spont     1 Term      Vag-Spont          Past Medical History  Diagnosis Date  . History of gestational diabetes     Past Surgical History  Procedure Laterality Date  . Fallopian tube removal    . Mouth surgery      Family History  Problem Relation Age of Onset  . Osteoporosis Mother   . COPD Father   . COPD Paternal Grandmother     Social History  Substance Use Topics  . Smoking status: Current Some Day Smoker  . Smokeless tobacco: None  . Alcohol Use: No    Allergies: No Known Allergies  Prescriptions prior to admission  Medication Sig Dispense Refill Last Dose  . Prenatal Vit-Fe Fumarate-FA (PRENATAL MULTIVITAMIN) TABS tablet Take 1 tablet by mouth daily at 12 noon.    Past Week at Unknown time    Review of Systems  Constitutional: Negative for fever.  Gastrointestinal: Positive for abdominal pain. Negative for heartburn, nausea, vomiting, diarrhea and constipation.   Physical Exam   Last menstrual period 01/25/2015.  Physical Exam  Nursing note and vitals reviewed. Constitutional: She is oriented to person, place, and time. She appears well-developed and well-nourished. No distress.  HENT:  Head: Normocephalic.  Eyes: Pupils are  equal, round, and reactive to light.  Neck: Neck supple.  Cardiovascular: Normal rate.   Respiratory: Effort normal.  GI: Soft. There is no tenderness.  UCs palpate mild  Genitourinary:   VE by RN: 3/50/-2 with no change on re-exam  Musculoskeletal: Normal range of motion.  Neurological: She is alert and oriented to person, place, and time.   EFM FHR baseline 125-130, moderate variablility, reactive Toco: UCs q 3-6 min x30-50 sec  MAU Course  Procedures Results for orders placed or performed during the hospital encounter of 09/24/15 (from the past 24 hour(s))  Urinalysis, Routine w reflex microscopic (not at [facility])     Status: Abnormal   Collection Time: 09/24/15  5:08 PM  Result Value Ref Range   Color, Urine YELLOW YELLOW   APPearance CLEAR CLEAR   Specific Gravity, Urine 1.010 1.005 - 1.030   pH 6.0 5.0 - 8.0   Glucose, UA NEGATIVE NEGATIVE mg/dL   Hgb urine dipstick NEGATIVE NEGATIVE   Bilirubin Urine NEGATIVE NEGATIVE   Ketones, ur 15 (A) NEGATIVE mg/dL   Protein, ur NEGATIVE NEGATIVE mg/dL   Nitrite NEGATIVE NEGATIVE   Leukocytes, UA NEGATIVE NEGATIVE  Amnisure rupture of  membrane (rom)not at [facility]     Status: None   Collection Time: 09/24/15  6:15 PM  Result Value Ref Range   Amnisure ROM NEGATIVE   Fern Test     Status: Normal   Collection Time: 09/24/15  6:26 PM  Result Value Ref Range   POCT Fern Test      MDM Assumed care from Vonzella NippleJulie Wenzel, [place] at [phone] Drinking fluids, declines IV hydration. Consulted Dr. Su Hiltoberts at [phone]: Procardia ordered, however patient declines the Procardia  Discussed risk of NICU admission and she understands.   Assessment and Plan   1. Preterm contractions, third trimester   G4P3003 at 2053w4d not in established labor and without evident PROM [facility]   Discharge home with pelvic rest, return for worsening symptoms.    Medication List    TAKE these medications        prenatal multivitamin Tabs tablet  Take 1  tablet by mouth daily at 12 noon.       Follow-up Information    [facility].   Specialty:  Obstetrics and Gynecology   Why:  Keep your scheduled prenatal appointment   Contact information:   [address] [place] [id number] [phone]      Please follow up.   Why:  Keep your scheduled prenatal appointment     Call office to be seen this coming week.   Advised pelvic rest.  Samantha [name] 09/24/2015, 7:44 PM

## 2015-09-24 NOTE — MAU Provider Note (Signed)
History     CSN: [id number]  Arrival date and time: 09/24/15 1656   First Provider Initiated Contact with Patient 09/24/15 1732      Chief Complaint  Patient presents with  . Rupture of Membranes   HPI [name] is a 30 y.o. [phone] at [redacted]w[redacted]d who presents to MAU today with complaint of contractions and possible LOF. The patient states that frequent contractions have been a daily occurrence since ~ 20 weeks. The LOF is new today. She denies vaginal bleeding or complications with this pregnancy. She states normal fetal movement today. She missed her last appointment in the office last week, but states at last visit ~ 3 weeks ago cervix was closed.   OB History    Gravida Para Term Preterm AB TAB SAB Ectopic Multiple Living   Past Medical History  Diagnosis Date  . History of gestational diabetes     Past Surgical History  Procedure Laterality Date  . Fallopian tube removal    . Mouth surgery      Family History  Problem Relation Age of Onset  . Osteoporosis Mother   . COPD Father   . COPD Paternal Grandmother     Social History  Substance Use Topics  . Smoking status: Current Some Day Smoker  . Smokeless tobacco: None  . Alcohol Use: No    Allergies: No Known Allergies  No prescriptions prior to admission    Review of Systems  Constitutional: Negative for fever and malaise/fatigue.  Gastrointestinal: Positive for nausea and abdominal pain. Negative for vomiting, diarrhea and constipation.  Genitourinary: Negative for dysuria, urgency and frequency.       Neg - vaginal bleeding + LOF   Physical Exam   Blood pressure 112/73, pulse 78, resp. rate 16, last menstrual period 01/25/2015.  Physical Exam  Nursing note and vitals reviewed. Constitutional: She is oriented to person, place, and time. She appears well-developed and well-nourished. No distress.  HENT:  Head: Normocephalic and atraumatic.  Cardiovascular: Normal rate.    Respiratory: Effort normal.  GI: Soft. She exhibits no distension and no mass. There is no tenderness. There is no rebound and no guarding.  Neurological: She is alert and oriented to person, place, and time.  Skin: Skin is warm and dry. No erythema.  Psychiatric: She has a normal mood and affect.  Dilation: 3 Effacement (%): 50 Cervical Position: Middle Station: -3 Presentation: Vertex Exam by:: Claud Kelp RN  Results for orders placed or performed during the hospital encounter of 09/24/15 (from the past 48 hour(s))  Urinalysis, Routine w reflex microscopic (not at [facility])     Status: Abnormal   Collection Time: 09/24/15  5:08 PM  Result Value Ref Range   Color, Urine YELLOW YELLOW   APPearance CLEAR CLEAR   Specific Gravity, Urine 1.010 1.005 - 1.030   pH 6.0 5.0 - 8.0   Glucose, UA NEGATIVE NEGATIVE mg/dL   Hgb urine dipstick NEGATIVE NEGATIVE   Bilirubin Urine NEGATIVE NEGATIVE   Ketones, ur 15 (A) NEGATIVE mg/dL   Protein, ur NEGATIVE NEGATIVE mg/dL   Nitrite NEGATIVE NEGATIVE   Leukocytes, UA NEGATIVE NEGATIVE    Comment: MICROSCOPIC NOT DONE ON URINES WITH NEGATIVE PROTEIN, BLOOD, LEUKOCYTES, NITRITE, OR GLUCOSE <1000 mg/dL.  Amnisure rupture of membrane (rom)not at [facility]     Status: None   Collection Time: 09/24/15  6:15 PM  Result Value Ref  Range   Amnisure ROM NEGATIVE   Fern Test     Status: Normal   Collection Time: 09/24/15  6:26 PM  Result Value Ref Range   POCT Fern Test       Fetal Monitoring: Baseline: 130 bpm Variability: moderate Accelerations: 15 x 15 Decelerations: none Contractions: q 2-6 mins  MAU Course  Procedures None  MDM UA today Fern- negative Discussed patient with Dr. Estanislado Pandyivard. She recommends Amnisure today to confirm membranes intact. Continue to monitor x 1 hour and recheck cervix.  Care turned over to Standard PacificDeirdre Poe, CNM. Please see her documentation for completion of visit.   Marny LowensteinJulie N Everitt Wenner, PA-C  09/25/2015, 8:19 PM  Assessment  and Plan

## 2015-10-02 LAB — OB RESULTS CONSOLE GBS: GBS: NEGATIVE

## 2015-10-18 ENCOUNTER — Inpatient Hospital Stay (HOSPITAL_COMMUNITY): Payer: Medicaid Other | Admitting: Anesthesiology

## 2015-10-18 ENCOUNTER — Inpatient Hospital Stay (HOSPITAL_COMMUNITY)
Admission: AD | Admit: 2015-10-18 | Discharge: 2015-10-20 | DRG: 775 | Disposition: A | Discharge status: Home / Self Care | Payer: Medicaid Other | Source: Ambulatory Visit | Attending: Obstetrics and Gynecology | Admitting: Obstetrics and Gynecology

## 2015-10-18 ENCOUNTER — Encounter (HOSPITAL_COMMUNITY): Payer: Self-pay

## 2015-10-18 DIAGNOSIS — Z9079 Acquired absence of other genital organ(s): Secondary | ICD-10-CM

## 2015-10-18 DIAGNOSIS — F410 Panic disorder [episodic paroxysmal anxiety] without agoraphobia: Secondary | ICD-10-CM | POA: Diagnosis present

## 2015-10-18 DIAGNOSIS — Z8632 Personal history of gestational diabetes: Secondary | ICD-10-CM | POA: Diagnosis not present

## 2015-10-18 DIAGNOSIS — O99334 Smoking (tobacco) complicating childbirth: Secondary | ICD-10-CM | POA: Diagnosis present

## 2015-10-18 DIAGNOSIS — F418 Other specified anxiety disorders: Secondary | ICD-10-CM | POA: Diagnosis present

## 2015-10-18 DIAGNOSIS — O99344 Other mental disorders complicating childbirth: Secondary | ICD-10-CM | POA: Diagnosis present

## 2015-10-18 DIAGNOSIS — Z8742 Personal history of other diseases of the female genital tract: Secondary | ICD-10-CM

## 2015-10-18 DIAGNOSIS — F329 Major depressive disorder, single episode, unspecified: Secondary | ICD-10-CM | POA: Diagnosis present

## 2015-10-18 DIAGNOSIS — O4202 Full-term premature rupture of membranes, onset of labor within 24 hours of rupture: Principal | ICD-10-CM | POA: Diagnosis present

## 2015-10-18 DIAGNOSIS — Z8262 Family history of osteoporosis: Secondary | ICD-10-CM | POA: Diagnosis not present

## 2015-10-18 DIAGNOSIS — Z90721 Acquired absence of ovaries, unilateral: Secondary | ICD-10-CM

## 2015-10-18 DIAGNOSIS — Z825 Family history of asthma and other chronic lower respiratory diseases: Secondary | ICD-10-CM | POA: Diagnosis not present

## 2015-10-18 DIAGNOSIS — O4290 Premature rupture of membranes, unspecified as to length of time between rupture and onset of labor, unspecified weeks of gestation: Secondary | ICD-10-CM | POA: Diagnosis present

## 2015-10-18 DIAGNOSIS — F32A Depression, unspecified: Secondary | ICD-10-CM | POA: Diagnosis present

## 2015-10-18 DIAGNOSIS — Z3A38 38 weeks gestation of pregnancy: Secondary | ICD-10-CM | POA: Diagnosis not present

## 2015-10-18 DIAGNOSIS — F419 Anxiety disorder, unspecified: Secondary | ICD-10-CM | POA: Diagnosis present

## 2015-10-18 DIAGNOSIS — F17200 Nicotine dependence, unspecified, uncomplicated: Secondary | ICD-10-CM | POA: Diagnosis present

## 2015-10-18 HISTORY — DX: Anxiety disorder, unspecified: F41.9

## 2015-10-18 LAB — TYPE AND SCREEN
ABO/RH(D): O POS
ANTIBODY SCREEN: NEGATIVE

## 2015-10-18 LAB — RPR: RPR Ser Ql: NONREACTIVE

## 2015-10-18 LAB — CBC
HCT: 33.2 % — ABNORMAL LOW (ref 36.0–46.0)
Hemoglobin: 11.1 g/dL — ABNORMAL LOW (ref 12.0–15.0)
MCH: 31.4 pg (ref 26.0–34.0)
MCHC: 33.4 g/dL (ref 30.0–36.0)
MCV: 94.1 fL (ref 78.0–100.0)
PLATELETS: 211 10*3/uL (ref 150–400)
RBC: 3.53 MIL/uL — ABNORMAL LOW (ref 3.87–5.11)
RDW: 13.7 % (ref 11.5–15.5)
WBC: 11.1 10*3/uL — AB (ref 4.0–10.5)

## 2015-10-18 LAB — POCT FERN TEST: POCT Fern Test: POSITIVE

## 2015-10-18 MED ORDER — ZOLPIDEM TARTRATE 5 MG PO TABS: 5.0000 mg | ORAL_TABLET | Freq: Every evening | ORAL | Status: DC | PRN

## 2015-10-18 MED ORDER — SENNOSIDES-DOCUSATE SODIUM 8.6-50 MG PO TABS
2.0000 | ORAL_TABLET | ORAL | Status: DC
  Administered 2015-10-19 (×2): 2 via ORAL
  Filled 2015-10-18 (×2): qty 2

## 2015-10-18 MED ORDER — OXYTOCIN 40 UNITS IN LACTATED RINGERS INFUSION - SIMPLE MED
1.0000 m[IU]/min | INTRAVENOUS | Status: DC
  Administered 2015-10-18: 4 m[IU]/min via INTRAVENOUS
  Administered 2015-10-18: 2 m[IU]/min via INTRAVENOUS

## 2015-10-18 MED ORDER — COCONUT OIL OIL
1.0000 "application " | TOPICAL_OIL | Status: DC | PRN
  Administered 2015-10-18 (×2): 1 via TOPICAL
  Filled 2015-10-18: qty 120

## 2015-10-18 MED ORDER — ONDANSETRON HCL 4 MG/2ML IJ SOLN: 4.0000 mg | INTRAMUSCULAR | Status: DC | PRN

## 2015-10-18 MED ORDER — LACTATED RINGERS IV SOLN
INTRAVENOUS | Status: DC
  Administered 2015-10-18: 950 mL via INTRAVENOUS

## 2015-10-18 MED ORDER — PHENYLEPHRINE 40 MCG/ML (10ML) SYRINGE FOR IV PUSH (FOR BLOOD PRESSURE SUPPORT)
80.0000 ug | PREFILLED_SYRINGE | INTRAVENOUS | Status: DC | PRN
  Administered 2015-10-18: 80 ug via INTRAVENOUS
  Filled 2015-10-18: qty 5

## 2015-10-18 MED ORDER — FENTANYL 2.5 MCG/ML BUPIVACAINE 1/10 % EPIDURAL INFUSION (WH - ANES)
14.0000 mL/h | INTRAMUSCULAR | Status: DC | PRN
  Administered 2015-10-18 (×2): 14 mL/h via EPIDURAL
  Filled 2015-10-18: qty 125

## 2015-10-18 MED ORDER — EPHEDRINE 5 MG/ML INJ
10.0000 mg | INTRAVENOUS | Status: DC | PRN
  Administered 2015-10-18: 10 mg via INTRAVENOUS
  Filled 2015-10-18: qty 4
  Filled 2015-10-18: qty 2
  Filled 2015-10-18: qty 4

## 2015-10-18 MED ORDER — EPHEDRINE 5 MG/ML INJ
10.0000 mg | INTRAVENOUS | Status: AC | PRN
  Administered 2015-10-18 (×2): 10 mg via INTRAVENOUS

## 2015-10-18 MED ORDER — FENTANYL CITRATE (PF) 100 MCG/2ML IJ SOLN: 50.0000 ug | INTRAMUSCULAR | Status: DC | PRN

## 2015-10-18 MED ORDER — TERBUTALINE SULFATE 1 MG/ML IJ SOLN
0.2500 mg | Freq: Once | INTRAMUSCULAR | Status: DC | PRN
  Filled 2015-10-18: qty 1

## 2015-10-18 MED ORDER — SOD CITRATE-CITRIC ACID 500-334 MG/5ML PO SOLN: 30.0000 mL | ORAL | Status: DC | PRN

## 2015-10-18 MED ORDER — LIDOCAINE HCL (PF) 1 % IJ SOLN
INTRAMUSCULAR | Status: DC | PRN
  Administered 2015-10-18: 5 mL via EPIDURAL
  Administered 2015-10-18: 7 mL via EPIDURAL

## 2015-10-18 MED ORDER — FLEET ENEMA 7-19 GM/118ML RE ENEM: 1.0000 | ENEMA | RECTAL | Status: DC | PRN

## 2015-10-18 MED ORDER — TETANUS-DIPHTH-ACELL PERTUSSIS 5-2.5-18.5 LF-MCG/0.5 IM SUSP: 0.5000 mL | Freq: Once | INTRAMUSCULAR | Status: DC

## 2015-10-18 MED ORDER — OXYTOCIN BOLUS FROM INFUSION: 500.0000 mL | INTRAVENOUS | Status: DC

## 2015-10-18 MED ORDER — SIMETHICONE 80 MG PO CHEW: 80.0000 mg | CHEWABLE_TABLET | ORAL | Status: DC | PRN

## 2015-10-18 MED ORDER — ONDANSETRON HCL 4 MG PO TABS: 4.0000 mg | ORAL_TABLET | ORAL | Status: DC | PRN

## 2015-10-18 MED ORDER — IBUPROFEN 600 MG PO TABS
600.0000 mg | ORAL_TABLET | Freq: Four times a day (QID) | ORAL | Status: DC
  Administered 2015-10-18 – 2015-10-20 (×8): 600 mg via ORAL
  Filled 2015-10-18 (×8): qty 1

## 2015-10-18 MED ORDER — OXYCODONE-ACETAMINOPHEN 5-325 MG PO TABS: 2.0000 | ORAL_TABLET | ORAL | Status: DC | PRN

## 2015-10-18 MED ORDER — ACETAMINOPHEN 325 MG PO TABS: 650.0000 mg | ORAL_TABLET | ORAL | Status: DC | PRN

## 2015-10-18 MED ORDER — BENZOCAINE-MENTHOL 20-0.5 % EX AERO
1.0000 "application " | INHALATION_SPRAY | CUTANEOUS | Status: DC | PRN
  Administered 2015-10-18: 1 via TOPICAL
  Filled 2015-10-18: qty 56

## 2015-10-18 MED ORDER — PRENATAL MULTIVITAMIN CH
1.0000 | ORAL_TABLET | Freq: Every day | ORAL | Status: DC
  Filled 2015-10-18 (×2): qty 1

## 2015-10-18 MED ORDER — OXYTOCIN 40 UNITS IN LACTATED RINGERS INFUSION - SIMPLE MED
2.5000 [IU]/h | INTRAVENOUS | Status: DC
  Administered 2015-10-18: 500 [IU]/h via INTRAVENOUS
  Filled 2015-10-18: qty 1000

## 2015-10-18 MED ORDER — ONDANSETRON HCL 4 MG/2ML IJ SOLN
4.0000 mg | Freq: Four times a day (QID) | INTRAMUSCULAR | Status: DC | PRN
  Filled 2015-10-18: qty 2

## 2015-10-18 MED ORDER — OXYCODONE-ACETAMINOPHEN 5-325 MG PO TABS: 1.0000 | ORAL_TABLET | ORAL | Status: DC | PRN

## 2015-10-18 MED ORDER — WITCH HAZEL-GLYCERIN EX PADS: 1.0000 "application " | MEDICATED_PAD | CUTANEOUS | Status: DC | PRN

## 2015-10-18 MED ORDER — DIPHENHYDRAMINE HCL 50 MG/ML IJ SOLN: 12.5000 mg | INTRAMUSCULAR | Status: DC | PRN

## 2015-10-18 MED ORDER — PHENYLEPHRINE 40 MCG/ML (10ML) SYRINGE FOR IV PUSH (FOR BLOOD PRESSURE SUPPORT)
80.0000 ug | PREFILLED_SYRINGE | INTRAVENOUS | Status: DC | PRN
  Filled 2015-10-18: qty 10
  Filled 2015-10-18: qty 5

## 2015-10-18 MED ORDER — MEASLES, MUMPS & RUBELLA VAC ~~LOC~~ INJ
0.5000 mL | INJECTION | Freq: Once | SUBCUTANEOUS | Status: DC
  Filled 2015-10-18: qty 0.5

## 2015-10-18 MED ORDER — LIDOCAINE HCL (PF) 1 % IJ SOLN
30.0000 mL | INTRAMUSCULAR | Status: DC | PRN
  Filled 2015-10-18: qty 30

## 2015-10-18 MED ORDER — LACTATED RINGERS IV SOLN: 500.0000 mL | Freq: Once | INTRAVENOUS | Status: DC

## 2015-10-18 MED ORDER — LACTATED RINGERS IV SOLN: 500.0000 mL | INTRAVENOUS | Status: DC | PRN

## 2015-10-18 MED ORDER — DIBUCAINE 1 % RE OINT: 1.0000 "application " | TOPICAL_OINTMENT | RECTAL | Status: DC | PRN

## 2015-10-18 MED ORDER — DIPHENHYDRAMINE HCL 25 MG PO CAPS: 25.0000 mg | ORAL_CAPSULE | Freq: Four times a day (QID) | ORAL | Status: DC | PRN

## 2015-10-18 MED ORDER — ACETAMINOPHEN 325 MG PO TABS
650.0000 mg | ORAL_TABLET | ORAL | Status: DC | PRN
  Administered 2015-10-19: 650 mg via ORAL
  Filled 2015-10-18: qty 2

## 2015-10-18 NOTE — Progress Notes (Signed)
This note also relates to the following rows which could not be included: BP - Cannot attach notes to unvalidated device data Pulse Rate - Cannot attach notes to unvalidated device data Summary note 5 min decel down into 80's three additional RN's in room, anesthesia notified, position changes 02 @10  ml, spontaneous return to baseline.

## 2015-10-18 NOTE — Anesthesia Procedure Notes (Signed)
Epidural Patient location during procedure: OB Start time: 10/18/2015 8:56 AM End time: 10/18/2015 9:00 AM  Staffing Anesthesiologist: Leilani AbleHATCHETT, Shariya Gaster Performed by: anesthesiologist   Preanesthetic Checklist Completed: patient identified, surgical consent, pre-op evaluation, timeout performed, IV checked, risks and benefits discussed and monitors and equipment checked  Epidural Patient position: sitting Prep: site prepped and draped and DuraPrep Patient monitoring: continuous pulse ox and blood pressure Approach: midline Location: L3-L4 Injection technique: LOR air  Needle:  Needle type: Tuohy  Needle gauge: 17 G Needle length: 9 cm and 9 Needle insertion depth: 4 cm Catheter type: closed end flexible Catheter size: 19 Gauge Catheter at skin depth: 9 cm Test dose: negative and Other  Assessment Sensory level: T9 Events: blood not aspirated, injection not painful, no injection resistance, negative IV test and no paresthesia  Additional Notes Reason for block:procedure for pain

## 2015-10-18 NOTE — H&P (Signed)
Samantha [name] is a 30 y.o. female, G4P3003 at 38 weeks, presenting for rupture of membranes with contractions increasing in frequency and intensity.  Report rupture of membranes with moderate amount of clear fluid at approximately 0300 this morning. Endorses active fetus and mild contractions every 5-10 min.   Last cervical exam in the [place] [facility],  3/70/-3 on 09/26/15.    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    Patient Active Problem List   Diagnosis Date Noted  . History of left salpingo-oophorectomy 10/18/2015  . Rupture of membranes with delay of delivery 10/18/2015  . Panic attacks 04/21/2015  . History of gestational diabetes 04/21/2015  . Depression 04/21/2015  . Anxiety 04/21/2015  . Smoker 04/21/2015    History of present pregnancy: Patient entered care at 12 weeks.   EDC of 11/01/15 was established by LMP of 01/25/15.    Anatomy scan:  20wks on   06/14/15, with normal findings and an anterior placenta.    SIUP, vertex, cervix closed, measured transabdominally 4.2 cm, anterior placenta - placental edge 5.6cm from  internal os, fluid is normal. CP, thal, nb, profile, orbits, palate,  3VV, Diaphragm, Renal arteries seen - anatomy  complete Additional US evaluations: 35.5 wks  Growth on 10/02/15:  VTX, EFW 6+2, 52%ILE, NORMAL FLUID.    Significant prenatal events:  First Trimester:   Declined genetic testing, BV with Rx flagyl at 12wks.MAU visit 04/21/15 for insomnia,depression, and panic attacks, RX Zoloft and Vistaril provided- referral to [facility] .     Abnormal pap , ASCUS, HPV Second Trimester:  Colpo on 05/25/15 for HR HPV, plan to repeat colpo PP.   [facility] with c/o costochondritis. Early glucola Wnl Third Trimester:  Common discomforts of pregancny - braxton jicks, pressure, pedal swelling.   Desires BTL-  Has has had left tube removed.  Papers signed  09/26/15.  MAU visit at 35wks for loss of mucous  plug SVe: 3/70/-3  Last evaluation:  37.2 wks on 10/13/15 by S. Devane-Johnson, CNM, +FM, BP 110/64,   126 lbs ; 1wk ROB GBS neg No recent MAU/hospital visits No complaints on today. Vaccines UTD. JNR,CMA Mood stable A1. 29 yo G4p3003 at 37 2/7 weeks 2. ROB P1. Labs reviewed 2. Labored reviewed 3. increase water 4. RTC 1 week.     OB History    Gravida Para Term Preterm AB TAB SAB Ectopic Multiple Living   4 3 3       3      05/21/2003, 40wks,  F, 6lbs, SVD 05/07/2009, 40wks, F, 6lbs 1oz, SVD 06/26/2010, 40 wks, F, 5lbs 4oz, SVD    Past Medical History  Diagnosis Date  . History of gestational diabetes   . Anxiety     panic attacks   Past Surgical History  Procedure Laterality Date  . Fallopian tube removal    . Mouth surgery     Family History: family history includes COPD in her father and paternal grandmother; Osteoporosis in her mother. Social History:  reports that she has been smoking.  She does not have any smokeless tobacco history on file. She reports that she does not drink alcohol or use illicit drugs.  Caucasian, 12th grade education with some college, homemaker, single and heterosexual   Prenatal Transfer Tool  Maternal Diabetes: No/ Genetic Screening: Declined Maternal Ultrasounds/Referrals: Normal Fetal Ultrasounds or other Referrals:  None Maternal Substance Abuse:  No Significant  Maternal Medications:  Meds include: Zoloft Other:  PNV, Vistaril  Significant Maternal Lab Results: Lab values include: Group B Strep negative  TDAP 09/07/15 Flu 04/19/15  ROS:  All systems reviewed and wnl expect as noted in HPI  No Known Allergies   Dilation: 4 Effacement (%): 60 Station: -1 Exam by:: R. Alexandr Oehler CNM Blood pressure 117/70, pulse 80, temperature 97.7 F (36.5 C), temperature source Oral, resp. rate 15, height  (1.6 m), weight 56.7 kg (125 lb), last menstrual period 01/25/2015, SpO2 100 %. Vertex by Digital exam   Chest clear Heart RRR without  murmur Abd gravid, NT, FH 37wks Pelvic: proven to 6 lbs Ext: wnl  FHR: Category 1, 125-130 bpm, moderate variability, +accels, no decels UCs:   Irregular, every 1-4 min, palpate mild  Prenatal labs: ABO, Rh: --/--/O POS (10/16 1225) Antibody:     Negative 04/14/15 Rubella:  !Error!  Immune  RPR:     NR   04/14/15, 06/14/15 HBsAg:    Negative  04/13/16 HIV: Non Reactive (10/16 1225)  GBS: Negative (05/22 0000) Sickle cell/Hgb electrophoresis:  AA% Pap:  Abnornal, HPV positive,   04/19/15  GC:  Negative   04/14/15, 12/014/16 Chlamydia:  Negative   04/14/15, 12/014/16 Genetic screenings:    declined Glucola:  wnl Other:   Hgb 12.8 at NOB, 10.8 at 28 weeks    Assessment/Plan: IUP at 38.0 SROM at 0300, clear moderate amount Early labor Desired BTL, papers signed 09/26/15 and on shadow chart Hx Left Salpingo-oopherectomy  HX anxiety, depression, panic attacks  GBS negative Cat 1 FT    Plan: Admit to Birthing Suite per consult with Dr. Su Hilt Routine [facility] orders Pain med/epidural prn Expectant management Anticipate SVD  Beatrix Fetters, [place] 10/18/2015, 6:40 AM

## 2015-10-18 NOTE — Progress Notes (Signed)
Samantha Samantha [name] is Samantha [name] 30 y.o. G4P3003 at 5224w0d by LMP admitted for rupture of membranes  Subjective:   Objective: BP 129/77 mmHg  Pulse 93  Temp(Src) 97 F (36.1 C) (Axillary)  Resp 18  Ht 5\' 6"  (1.676 m)  Wt 125 lb (56.7 kg)  BMI 20.19 kg/m2  SpO2 100%  LMP 01/25/2015      FHT:  FHR: 145 bpm, variability: moderate,  accelerations:  Present,  decelerations:  Present one prolonged decel to the 70s for 5 minutes UC:   regular, every 5 minutes SVE:   Dilation: 4.5 Effacement (%): 80 Station: -1 Exam by:: D Herr rn  Labs: Lab Results  Component Value Date   WBC 11.1* 10/18/2015   HGB 11.1* 10/18/2015   HCT 33.2* 10/18/2015   MCV 94.1 10/18/2015   PLT 211 10/18/2015    Assessment / Plan: SROM  Labor: fore waters ruptured.  clear fluid Preeclampsia:  no signs or symptoms of toxicity Fetal Wellbeing:  Category I Pain Control:  Epidural I/D:  Decel recovered with suppotive meds.  was Samantha [name] result of hyprtension from the epidural Anticipated MOD:  NSVD and Discussed waiting to do tubal LS bc of H/O PID.  may have scar tissure  Samantha Samantha [name] 10/18/2015, 12:01 PM

## 2015-10-18 NOTE — Anesthesia Preprocedure Evaluation (Signed)
Anesthesia Evaluation  Patient identified by MRN, date of birth, ID band Patient awake    Reviewed: Allergy & Precautions, H&P , NPO status , Patient's Chart, lab work & pertinent test results  Airway Mallampati: I  TM Distance: >3 FB Neck ROM: full    Dental no notable dental hx.    Pulmonary Current Smoker,    Pulmonary exam normal       Cardiovascular negative cardio ROS Normal cardiovascular exam    Neuro/Psych negative neurological ROS     GI/Hepatic negative GI ROS, Neg liver ROS,   Endo/Other  negative endocrine ROS  Renal/GU negative Renal ROS     Musculoskeletal   Abdominal Normal abdominal exam  (+)   Peds  Hematology negative hematology ROS (+)   Anesthesia Other Findings   Reproductive/Obstetrics (+) Pregnancy                             Anesthesia Physical Anesthesia Plan  ASA: II  Anesthesia Plan: Epidural   Post-op Pain Management:    Induction:   Airway Management Planned:   Additional Equipment:   Intra-op Plan:   Post-operative Plan:   Informed Consent: I have reviewed the patients History and Physical, chart, labs and discussed the procedure including the risks, benefits and alternatives for the proposed anesthesia with the patient or authorized representative who has indicated his/her understanding and acceptance.     Plan Discussed with:   Anesthesia Plan Comments:         Anesthesia Quick Evaluation  

## 2015-10-18 NOTE — Anesthesia Pain Management Evaluation Note (Signed)
  CRNA Pain Management Visit Note  Patient: Samantha [name], 30 y.o., female  "Hello I am a member of the anesthesia [facility] at [facility]. We have an anesthesia team available at all times to provide care throughout the hospital, including epidural management and anesthesia for C-section. I don't know your plan for the delivery whether it a natural birth, water birth, IV sedation, nitrous supplementation, doula or epidural, but we want to meet your pain goals."   1.Was your pain managed to your expectations on prior hospitalizations?   Yes   2.What is your expectation for pain management during this hospitalization?     Epidural  3.How can we help you reach that goal? Epidural  Record the patient's initial score and the patient's pain goal.   Pain: 6  Pain Goal: 8 [facility] wants you to be able to say your pain was always managed very well.  Cephus ShellingBURGER,Samantha [name] 10/18/2015

## 2015-10-18 NOTE — MAU Note (Signed)
Pt reports ? Leaking fluid since 0300. Mild contractions.

## 2015-10-19 LAB — CBC
HCT: 29.7 % — ABNORMAL LOW (ref 36.0–46.0)
Hemoglobin: 10.1 g/dL — ABNORMAL LOW (ref 12.0–15.0)
MCH: 31.8 pg (ref 26.0–34.0)
MCHC: 34 g/dL (ref 30.0–36.0)
MCV: 93.4 fL (ref 78.0–100.0)
PLATELETS: 168 10*3/uL (ref 150–400)
RBC: 3.18 MIL/uL — ABNORMAL LOW (ref 3.87–5.11)
RDW: 13.4 % (ref 11.5–15.5)
WBC: 12.4 10*3/uL — ABNORMAL HIGH (ref 4.0–10.5)

## 2015-10-19 NOTE — Progress Notes (Signed)
[place], Hospital doctorAmber.  DOB: 12-03-1985  Post Partum Day 1  Subjective:  Patient ambulating and voiding without difficulty.  Pain well controlled.  Scant lochia.  Breastfeeding baby. Desires information on paternity DNA testing.   Objective: Blood pressure 114/72, pulse 66, temperature 98.2 F (36.8 C), temperature source Oral, resp. rate 20, height 5\' 6"  (1.676 m), weight 56.7 kg (125 lb), last menstrual period 01/25/2015, SpO2 99 %, unknown if currently breastfeeding.  Physical Exam:  General: alert, cooperative and no distress Lochia: appropriate Uterine Fundus: firm Incision: Not applicable DVT Evaluation: No evidence of DVT seen on physical exam.   Recent Labs  10/18/15 0705 10/19/15 0539  HGB 11.1* 10.1*  HCT 33.2* 29.7*    Assessment/Plan: Routine postpartum care Plan for discharge tomorrow Contraception plan is for outpatient unilateral salpingectomy in postpartum period- f/u with Dr. Normand Sloopillard.    LOS: 1 day   [facility] Campus[facility]ULWA,Aarica Wax [facility]U 10/19/2015, 9:42 AM

## 2015-10-19 NOTE — Anesthesia Postprocedure Evaluation (Signed)
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  Procedure(s) Performed: * No procedures listed *  Patient location during evaluation: Mother Baby Anesthesia Type: Epidural Level of consciousness: awake Pain management: satisfactory to patient Vital Signs Assessment: post-procedure vital signs reviewed and stable Respiratory status: spontaneous breathing Cardiovascular status: stable Anesthetic complications: no     Last Vitals:  Filed Vitals:   10/18/15 2100 10/19/15 0500  BP: 143/82 108/71  Pulse: 69 58  Temp: 36.6 C 36.5 C  Resp: 18 18    Last Pain:  Filed Vitals:   10/19/15 0531  PainSc: 5    Pain Goal: Patients Stated Pain Goal: 3 (10/19/15 0516)               Cephus ShellingBURGER,Dannisha Eckmann

## 2015-10-19 NOTE — Progress Notes (Signed)
UR chart review completed.  

## 2015-10-19 NOTE — Lactation Note (Signed)
Lactation Consultation Note Mom is breast formula feeding. Mom states she hasn't been able to produce enough milk for the other children. Mom denies PCOS. Has little breast tissue to RT. Breast. Lt. Breast is small but slightly breast tissue. Baby is BF when entered rm, mom has good everted nipples, hand expressed easily colostrum. Mom states she produces colostrum well but not milk. Mom encouraged to feed baby 8-12 times/24 hours and with feeding cues. Mom encouraged to waken baby for feeds. Encouraged to monitor I&O. Educated about newborn behavior, swallowing, and satisfaction after feeding. Mom shown how to use DEBP & how to disassemble, clean, & reassemble parts. Mom knows to pump q3h for 15-20 min. Gave mom vial for hand expression, d/t mom states that she can hand express better than pumping. Referred to Baby and Me Book in Breastfeeding section Pg. 22-23 for position options and Proper latch demonstration.WH/LC brochure given w/resources, support groups and LC services. Patient Name: Samantha [name]: 10/19/2015 Reason for consult: Follow-up assessment   Maternal Data Has patient been taught Hand Expression?: Yes Does the patient have breastfeeding experience prior to this delivery?: Yes  Feeding Feeding Type: Breast Fed Length of feed: 20 min  LATCH Score/Interventions Latch: Repeated attempts needed to sustain latch, nipple held in mouth throughout feeding, stimulation needed to elicit sucking reflex. Intervention(s): Adjust position;Assist with latch;Breast massage;Breast compression  Audible Swallowing: A few with stimulation Intervention(s): Skin to skin;Hand expression;Alternate breast massage  Type of Nipple: Everted at rest and after stimulation  Comfort (Breast/Nipple): Soft / non-tender     Hold (Positioning): Assistance needed to correctly position infant at breast and maintain latch. Intervention(s): Position options;Skin to skin;Support Pillows;Breastfeeding  basics reviewed  LATCH Score: 7  Lactation Tools Discussed/Used Tools: Pump Breast pump type: Double-Electric Breast Pump WIC Program: Yes Pump Review: Setup, frequency, and cleaning;Milk Storage Initiated by:: Peri JeffersonL. Cortlin Marano RN [name] initiated:: 10/19/15   Consult Status Consult Status: Follow-up [name]: 10/20/15 Follow-up type: In-patient    [name], [name] 10/19/2015, 7:21 AM

## 2015-10-20 NOTE — Discharge Summary (Signed)
[facility]   Patient Name:   Samantha [name] DOB:     December 24, 1985 MRN:     [id number]  Date of Admission:   10/18/2015 Date of Discharge:  10/20/2015  Admitting diagnosis:    38 WEEKS LEAKING FLUID MILD CTX Principal Problem:   Vaginal delivery Active Problems:   Panic attacks   History of gestational diabetes   Depression   Anxiety   Smoker   History of left salpingo-oophorectomy   H/O abnormal cervical Papanicolaou smear - +HPV, colpo 05/25/15      Discharge diagnosis:    38 WEEKS LEAKING FLUID MILD CTX Principal Problem:   Vaginal delivery Active Problems:   Panic attacks   History of gestational diabetes   Depression   Anxiety   Smoker   History of left salpingo-oophorectomy   H/O abnormal cervical Papanicolaou smear - +HPV, colpo 05/25/15  Term Pregnancy Delivered                                                                     Post partum procedures: None  Type of Delivery:  SVB  Delivering Provider: Jaymes GraffILLARD, NAIMA   Date of Delivery:  10/18/15  Newborn Data:    Live born female  Birth Weight: 6 lb 8 oz (2948 g) APGAR: 9, 10 Baby's Name:  Unknown Baby Feeding:   Breast Disposition:   home with mother  Complications:   None  Hospital course:      Onset of Labor With Vaginal Delivery     30 y.o. yo [id number] at 8669w0d was admitted in Active Labor on 10/18/2015. Patient had an uncomplicated labor course as follows:  Membrane Rupture Time/Date: 3:00 AM ,10/18/2015   Intrapartum Procedures: Episiotomy: None [1]                                         Lacerations:  None [1]  Patient had a delivery of a Viable infant. 10/18/2015  Information for the patient's newborn:  Jessica PriestWalker, Girl Nahima [[id number]][[id number]]  Delivery Method: Vag-Spont    Pateint had an uncomplicated postpartum course.  She is ambulating, tolerating a regular diet, passing flatus, and urinating well. Patient is discharged home in stable condition on 10/20/2015.  She  plans a tubal sterilization--this is to be done via laparoscopic approach, due to hx of PID and possible tubal disease.  She is to return to [facility] in [facility] weeks for pp exam.  Plans Ibuprophen OTC use.    Physical Exam:   Filed Vitals:   10/19/15 0500 10/19/15 0845 10/19/15 1842 10/20/15 0510  BP: 108/71 114/72 111/75 116/75  Pulse: 58 66 66 55  Temp: 97.7 F (36.5 C) 98.2 F (36.8 C) 98.5 F (36.9 C) 97.4 F (36.3 C)  TempSrc: Oral Oral Oral Oral  Resp: 18 20 18 16   Height:      Weight:      SpO2:  99%     General: alert Lochia: appropriate Uterine Fundus: firm Incision: Healing well with no significant drainage DVT Evaluation: No evidence of DVT seen on physical exam. Negative Homan's sign.  CBC Latest Ref Rng 10/19/2015 10/18/2015 06/02/2015  WBC 4.0 - 10.5 K/uL 12.4(H) 11.1(H) 10.6(H)  Hemoglobin 12.0 - 15.0 g/dL 10.1(L) 11.1(L) 11.4(L)  Hematocrit 36.0 - 46.0 % 29.7(L) 33.2(L) 33.1(L)  Platelets 150 - 400 K/uL 168 211 181     Discharge instruction: per After Visit Summary and "Baby and Me Booklet".  After Visit Meds:    Medication List    TAKE these medications        acetaminophen 500 MG tablet  Commonly known as:  TYLENOL  Take 500 mg by mouth every 6 (six) hours as needed for moderate pain.     prenatal multivitamin Tabs tablet  Take 1 tablet by mouth daily at 12 noon.        Diet: routine diet  Activity: Advance as tolerated. Pelvic rest for 6 weeks.   Outpatient follow up:4 weeks Follow up Appt:No future appointments. Follow up visit: 4 weeks pp  Postpartum contraception: Tubal Ligation  10/20/2015 Nigel Bridgeman, CNM

## 2015-10-20 NOTE — Lactation Note (Addendum)
This note was copied from a baby's chart. Lactation Consultation Note  P4, Baby < 6 lbs.  9.1% weight loss.  Breastfeeding and supplementing w/ formula. Baby sucking on pacifier upon entering. Pacifier use not recommended at this time.  Described that pacifier use can mask feeding cues and make latching more difficult. Encouraged feeding on demand and not only q3.   Mother states she is really not interested in pumping.  DEBP set up in room. Described that pumping is for stimulation. She states she prefers to breastfeed and supplement w/ formula. Mother has small breasts with what appears to be minimal breast tissue. Mother stated she likes to hand express instead of pumping.  Encouraged giving baby hand expressed milk. Mother easily hand expressed drops before latching baby.  Mother has volume guidelines.  After sucking on pacifier baby had difficulty latching.  It took a few minutes to settle her down to latch and then baby stayed on the breast for 2 min. Baby was fussy on and off.  Mother was able to calm baby. Plan is for mother to breastfeed (if able) and supplement with formula afterwards. Mom encouraged to feed baby 8-12 times/24 hours and with feeding cues at least every 3 hours. Reviewed engorgement care and monitoring voids/stools.           Patient Name: Samantha [name] BJYNW'GToday's Date: 10/20/2015 Reason for consult: Infant weight loss;Follow-up assessment   Maternal Data    Feeding Feeding Type: Breast Fed Length of feed: 2 min (fussy)  LATCH Score/Interventions Latch: Repeated attempts needed to sustain latch, nipple held in mouth throughout feeding, stimulation needed to elicit sucking reflex.  Audible Swallowing: None Intervention(s): Hand expression;Skin to skin  Type of Nipple: Everted at rest and after stimulation  Comfort (Breast/Nipple): Filling, red/small blisters or bruises, mild/mod discomfort  Problem noted: Mild/Moderate discomfort Interventions  (Mild/moderate discomfort): Hand expression (coconut oil)  Hold (Positioning): No assistance needed to correctly position infant at breast.  LATCH Score: 6  Lactation Tools Discussed/Used     Consult Status Consult Status: Follow-up Date: 10/21/15 Follow-up type: In-patient    Dahlia ByesBerkelhammer, Ruth [facility] 10/20/2015, 8:55 AM

## 2015-10-20 NOTE — Discharge Instructions (Signed)

## 2015-10-20 NOTE — Lactation Note (Signed)
This note was copied from a baby's chart. Lactation Consultation Note  Mother asked to talk w/ LC.  Mother recently hand expressed approx 2 ml of colostrum. Encouraged mother and discussed pumping hand free and answered questions. Discussed room temp breastmilk.   Patient Name: Samantha Wendie Simmermber Mcnatt EAVWU'JToday's Date: 10/20/2015 Reason for consult: Infant weight loss;Follow-up assessment   Maternal Data    Feeding Feeding Type: Breast Fed Length of feed: 2 min (fussy)  LATCH Score/Interventions Latch: Grasps breast easily, tongue down, lips flanged, rhythmical sucking.  Audible Swallowing: A few with stimulation Intervention(s): Hand expression;Skin to skin  Type of Nipple: Everted at rest and after stimulation  Comfort (Breast/Nipple): Filling, red/small blisters or bruises, mild/mod discomfort  Problem noted: Mild/Moderate discomfort Interventions (Mild/moderate discomfort): Hand expression (coconut oil)  Hold (Positioning): No assistance needed to correctly position infant at breast.  LATCH Score: 8  Lactation Tools Discussed/Used     Consult Status Consult Status: Follow-up Date: 10/21/15 Follow-up type: In-patient    Dahlia ByesBerkelhammer, Cambelle Suchecki [facility] 10/20/2015, 11:18 AM

## 2015-10-20 NOTE — Lactation Note (Signed)
This note was copied from a baby's chart. Lactation Consultation Note Mom has wide space between breast, little breast tissue, assymetrical breast w/everted nipples. Mom demonstrated hand expression last LC assessment w/colostrum. Baby has lost 9%. Had 5 voids, 3 stools, 5 emesis for the 1st day of life. LC discussed w/RN  Baby needs supplemented. Mom is to BF, pump, express colostrum after pumping, give baby colostrum obtained, gave LPI feeding supplemental feeding sheet. RN gave mom alimentum to supplement w/after colostrum given. Mom in agreement of plan.  Patient Name: Samantha [name] Today's Date: 10/20/2015     Maternal Data    Feeding    LATCH Score/Interventions                      Lactation Tools Discussed/Used     Consult Status      [name], Samantha [name] 10/20/2015, 6:48 AM

## 2015-11-10 ENCOUNTER — Other Ambulatory Visit: Payer: Self-pay | Admitting: Obstetrics and Gynecology

## 2015-11-15 NOTE — H&P (Signed)
Samantha [name] is a 30 y.o. female P: 4-0-0-4 who presents for tubal sterilization because she no longer desires to bear children.  The patient is aware of other methods of contraception however,  she desires a permanent method.   Past Medical History  OB History: G: 4;   P: 4-0-0-4  GYN History: menarche: 30 YO;    LMP: 01/25/15 (delivered  10/18/15)   Contracepton: None;  History of HPV and PID;  PAP Smear 04/2015:  ASCUS with Positive HPV   Medical History: Anxietty, Depression, GERD, Gestational Diabetes, B-12 Deficiency,  and Pelvic Inflammatory Disease  Surgical History: 2006  Left Salpingectomy (secondary to PID)   Family History: Diabetes Mellitus, Heart Disease, Stroke, Hypertension, Mental Disorders, Gout,  Osteoporosis and COPD  Social History: Single and a Futures traderHomemaker;  Smokes 1/4 PPD cigarettes and occasionally alcohol   Medications: Prenatal Vitamin daily  No Known Allergies  ROS: Admits to glasses but denies headache, vision changes, nasal congestion, dysphagia, tinnitus, dizziness, hoarseness, cough,  chest pain, shortness of breath, nausea, vomiting, diarrhea,constipation,  urinary frequency, urgency  dysuria, hematuria, vaginitis symptoms, pelvic pain, swelling of joints,easy bruising,  myalgias, arthralgias, skin rashes, unexplained weight loss and except as is mentioned in the history of present illness, patient's review of systems is otherwise negative.   Physical Exam  Bp: 104/62   P: 72 bpm     R: 16  Temperature: 98.3 degrees F orally     Weight: 111 lbs.  Height: 5\' 6"    BMI: 17.9  Neck: supple without masses or thyromegaly Lungs: clear to auscultation Heart: regular rate and rhythm Abdomen: soft, non-tender and no organomegaly Pelvic:EGBUS- wnl; vagina-normal rugae; uterus-normal size, cervix without lesions or motion tenderness; adnexae-no tenderness or masses Extremities:  no clubbing, cyanosis or edema   Assesment: Desire for Sterilization  S/P Left Salpingectomy   Disposition:  A discussion was held with patient regarding the indication for her procedure(s) along with the risks, which include but are not limited to: reaction to anesthesia, damage to adjacent organs, infection and excessive bleeding.  The patient verbalized understanding of these risks and has consented [facility]   CSN# [id number]   Samantha Belfield J. Lowell GuitarPowell, PA-C  for Dr. Pierre BaliNaima A. Dillard

## 2015-11-21 NOTE — Patient Instructions (Addendum)
Your procedure is scheduled on: Wednesday, 11/29/15  Enter through the Main Entrance at : 11:45 am Pick up desk phone and dial [phone] and inform us of your arrival.  Please call [phone] if you have any problems the morning of surgery.  Remember: Do not eat food after midnight: Tuesday Clear liquids are ok until: 9:00 am on day of surgery   You may brush your teeth the morning of surgery.  Take these meds the morning of surgery with a sip of water: NONE  Do NOT smoke the day of surgery.  DO NOT wear jewelry, eye make-up, lipstick,body lotion, or dark fingernail polish.  (Polished toes are ok) You may wear deodorant.  Patients discharged on the day of surgery will not be allowed to drive home. Wear loose fitting, comfortable clothes for your ride home.

## 2015-11-22 ENCOUNTER — Encounter (HOSPITAL_COMMUNITY)
Admission: RE | Admit: 2015-11-22 | Discharge: 2015-11-22 | Disposition: A | Discharge status: Home / Self Care | Payer: Medicaid Other | Source: Ambulatory Visit | Attending: Obstetrics and Gynecology | Admitting: Obstetrics and Gynecology

## 2015-11-22 ENCOUNTER — Encounter (HOSPITAL_COMMUNITY): Payer: Self-pay

## 2015-11-22 DIAGNOSIS — Z01812 Encounter for preprocedural laboratory examination: Secondary | ICD-10-CM | POA: Insufficient documentation

## 2015-11-22 HISTORY — DX: Headache: R51

## 2015-11-22 HISTORY — DX: Headache, unspecified: R51.9

## 2015-11-22 HISTORY — DX: Panic disorder (episodic paroxysmal anxiety): F41.0

## 2015-11-22 HISTORY — DX: Adverse effect of unspecified anesthetic, initial encounter: T41.45XA

## 2015-11-22 HISTORY — DX: Other complications of anesthesia, initial encounter: T88.59XA

## 2015-11-22 LAB — CBC
HCT: 39 % (ref 36.0–46.0)
Hemoglobin: 12.7 g/dL (ref 12.0–15.0)
MCH: 31.1 pg (ref 26.0–34.0)
MCHC: 32.6 g/dL (ref 30.0–36.0)
MCV: 95.6 fL (ref 78.0–100.0)
PLATELETS: 172 10*3/uL (ref 150–400)
RBC: 4.08 MIL/uL (ref 3.87–5.11)
RDW: 13 % (ref 11.5–15.5)
WBC: 7.8 10*3/uL (ref 4.0–10.5)

## 2015-11-29 ENCOUNTER — Encounter (HOSPITAL_COMMUNITY): Admission: RE | Payer: Self-pay | Source: Ambulatory Visit

## 2015-11-29 ENCOUNTER — Ambulatory Visit (HOSPITAL_COMMUNITY)
Admission: RE | Admit: 2015-11-29 | Payer: Medicaid Other | Source: Ambulatory Visit | Admitting: Obstetrics and Gynecology

## 2015-11-29 SURGERY — SALPINGECTOMY, UNILATERAL, LAPAROSCOPIC
Anesthesia: General | Laterality: Right

## 2017-04-28 ENCOUNTER — Encounter (HOSPITAL_COMMUNITY): Payer: Self-pay

## 2017-04-28 DIAGNOSIS — Y939 Activity, unspecified: Secondary | ICD-10-CM | POA: Diagnosis not present

## 2017-04-28 DIAGNOSIS — F17210 Nicotine dependence, cigarettes, uncomplicated: Secondary | ICD-10-CM | POA: Insufficient documentation

## 2017-04-28 DIAGNOSIS — Y999 Unspecified external cause status: Secondary | ICD-10-CM | POA: Diagnosis not present

## 2017-04-28 DIAGNOSIS — T07XXXA Unspecified multiple injuries, initial encounter: Secondary | ICD-10-CM | POA: Diagnosis not present

## 2017-04-28 DIAGNOSIS — S60011A Contusion of right thumb without damage to nail, initial encounter: Secondary | ICD-10-CM | POA: Diagnosis not present

## 2017-04-28 DIAGNOSIS — S6991XA Unspecified injury of right wrist, hand and finger(s), initial encounter: Secondary | ICD-10-CM | POA: Diagnosis present

## 2017-04-28 DIAGNOSIS — Y929 Unspecified place or not applicable: Secondary | ICD-10-CM | POA: Diagnosis not present
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
## 2017-04-29 ENCOUNTER — Emergency Department (HOSPITAL_COMMUNITY): Payer: Medicaid Other

## 2017-04-29 ENCOUNTER — Emergency Department (HOSPITAL_COMMUNITY)
Admission: EM | Admit: 2017-04-29 | Discharge: 2017-04-29 | Disposition: A | Discharge status: Home / Self Care | Payer: Medicaid Other | Attending: Emergency Medicine | Admitting: Emergency Medicine

## 2017-04-29 DIAGNOSIS — T07XXXA Unspecified multiple injuries, initial encounter: Secondary | ICD-10-CM

## 2017-04-29 DIAGNOSIS — S60011A Contusion of right thumb without damage to nail, initial encounter: Secondary | ICD-10-CM

## 2017-04-29 DIAGNOSIS — R519 Headache, unspecified: Secondary | ICD-10-CM

## 2017-04-29 DIAGNOSIS — R51 Headache: Secondary | ICD-10-CM

## 2017-04-29 MED ORDER — NAPROXEN 500 MG PO TABS
500.0000 mg | ORAL_TABLET | Freq: Two times a day (BID) | ORAL | 0 refills | Status: DC
Start: 2017-04-29 — End: 2018-05-11
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
## 2017-04-29 NOTE — ED Notes (Signed)
Patient transported to CT 

## 2017-04-29 NOTE — ED Notes (Signed)
Pt departed in NAD, refused use of wheelchair.  

## 2017-04-29 NOTE — ED Notes (Signed)
ED Provider at bedside. 

## 2018-05-10 ENCOUNTER — Other Ambulatory Visit: Payer: Self-pay

## 2018-05-10 ENCOUNTER — Encounter (HOSPITAL_COMMUNITY): Payer: Self-pay

## 2018-05-10 ENCOUNTER — Inpatient Hospital Stay (HOSPITAL_COMMUNITY)
Admission: AD | Admit: 2018-05-10 | Discharge: 2018-05-13 | DRG: 885 | Disposition: A | Discharge status: Home / Self Care | Payer: Medicaid Other | Source: Intra-hospital | Attending: Psychiatry | Admitting: Psychiatry

## 2018-05-10 ENCOUNTER — Emergency Department (HOSPITAL_COMMUNITY)
Admission: EM | Admit: 2018-05-10 | Discharge: 2018-05-10 | Disposition: A | Discharge status: Other Acute Inpatient Hospital | Payer: Medicaid Other | Attending: Emergency Medicine | Admitting: Emergency Medicine

## 2018-05-10 DIAGNOSIS — Z8632 Personal history of gestational diabetes: Secondary | ICD-10-CM

## 2018-05-10 DIAGNOSIS — G4700 Insomnia, unspecified: Secondary | ICD-10-CM | POA: Diagnosis present

## 2018-05-10 DIAGNOSIS — F411 Generalized anxiety disorder: Secondary | ICD-10-CM | POA: Diagnosis present

## 2018-05-10 DIAGNOSIS — Z9851 Tubal ligation status: Secondary | ICD-10-CM | POA: Diagnosis not present

## 2018-05-10 DIAGNOSIS — Z9079 Acquired absence of other genital organ(s): Secondary | ICD-10-CM

## 2018-05-10 DIAGNOSIS — Z793 Long term (current) use of hormonal contraceptives: Secondary | ICD-10-CM | POA: Diagnosis not present

## 2018-05-10 DIAGNOSIS — Z818 Family history of other mental and behavioral disorders: Secondary | ICD-10-CM

## 2018-05-10 DIAGNOSIS — Z91011 Allergy to milk products: Secondary | ICD-10-CM | POA: Diagnosis not present

## 2018-05-10 DIAGNOSIS — F410 Panic disorder [episodic paroxysmal anxiety] without agoraphobia: Secondary | ICD-10-CM | POA: Diagnosis present

## 2018-05-10 DIAGNOSIS — F17210 Nicotine dependence, cigarettes, uncomplicated: Secondary | ICD-10-CM | POA: Diagnosis present

## 2018-05-10 DIAGNOSIS — Z91410 Personal history of adult physical and sexual abuse: Secondary | ICD-10-CM | POA: Diagnosis not present

## 2018-05-10 DIAGNOSIS — F419 Anxiety disorder, unspecified: Secondary | ICD-10-CM | POA: Diagnosis not present

## 2018-05-10 DIAGNOSIS — R45851 Suicidal ideations: Secondary | ICD-10-CM | POA: Diagnosis present

## 2018-05-10 DIAGNOSIS — Z915 Personal history of self-harm: Secondary | ICD-10-CM | POA: Diagnosis not present

## 2018-05-10 DIAGNOSIS — F322 Major depressive disorder, single episode, severe without psychotic features: Secondary | ICD-10-CM | POA: Diagnosis present

## 2018-05-10 DIAGNOSIS — Z590 Homelessness: Secondary | ICD-10-CM | POA: Diagnosis not present

## 2018-05-10 DIAGNOSIS — F332 Major depressive disorder, recurrent severe without psychotic features: Secondary | ICD-10-CM | POA: Diagnosis not present

## 2018-05-10 DIAGNOSIS — Z79899 Other long term (current) drug therapy: Secondary | ICD-10-CM | POA: Insufficient documentation

## 2018-05-10 LAB — COMPREHENSIVE METABOLIC PANEL
ALBUMIN: 4.1 g/dL (ref 3.5–5.0)
ALK PHOS: 30 U/L — AB (ref 38–126)
ALT: 12 U/L (ref 0–44)
ANION GAP: 10 (ref 5–15)
AST: 17 U/L (ref 15–41)
BUN: 7 mg/dL (ref 6–20)
CALCIUM: 9.2 mg/dL (ref 8.9–10.3)
CO2: 22 mmol/L (ref 22–32)
Chloride: 108 mmol/L (ref 98–111)
Creatinine, Ser: 0.81 mg/dL (ref 0.44–1.00)
GFR calc non Af Amer: >60 mL/min (ref >60)
GLUCOSE: 81 mg/dL (ref 70–99)
POTASSIUM: 4.3 mmol/L (ref 3.5–5.1)
Sodium: 140 mmol/L (ref 135–145)
TOTAL PROTEIN: 7.8 g/dL (ref 6.5–8.1)
Total Bilirubin: 0.7 mg/dL (ref 0.3–1.2)

## 2018-05-10 LAB — CBC WITH DIFFERENTIAL/PLATELET
ABS IMMATURE GRANULOCYTES: 0.03 10*3/uL (ref 0.00–0.07)
BASOS ABS: 0 10*3/uL (ref 0.0–0.1)
BASOS PCT: 0 %
Eosinophils Absolute: 0 10*3/uL (ref 0.0–0.5)
Eosinophils Relative: 0 %
HCT: 42.5 % (ref 36.0–46.0)
Hemoglobin: 14.2 g/dL (ref 12.0–15.0)
IMMATURE GRANULOCYTES: 0 %
Lymphocytes Relative: 25 %
Lymphs Abs: 2.3 10*3/uL (ref 0.7–4.0)
MCH: 32.3 pg (ref 26.0–34.0)
MCHC: 33.4 g/dL (ref 30.0–36.0)
MCV: 96.8 fL (ref 80.0–100.0)
MONOS PCT: 4 %
Monocytes Absolute: 0.4 10*3/uL (ref 0.1–1.0)
NEUTROS ABS: 6.6 10*3/uL (ref 1.7–7.7)
NEUTROS PCT: 71 %
NRBC: 0 % (ref 0.0–0.2)
PLATELETS: 284 10*3/uL (ref 150–400)
RBC: 4.39 MIL/uL (ref 3.87–5.11)
RDW: 12.4 % (ref 11.5–15.5)
WBC: 9.3 10*3/uL (ref 4.0–10.5)

## 2018-05-10 LAB — RAPID URINE DRUG SCREEN, HOSP PERFORMED
Amphetamines: NOT DETECTED
Barbiturates: NOT DETECTED
Benzodiazepines: POSITIVE — AB
Cocaine: NOT DETECTED
OPIATES: NOT DETECTED
Tetrahydrocannabinol: POSITIVE — AB

## 2018-05-10 LAB — I-STAT BETA HCG BLOOD, ED (MC, WL, AP ONLY): I-stat hCG, quantitative: <5 m[IU]/mL (ref <5)

## 2018-05-10 MED ORDER — ACETAMINOPHEN 325 MG PO TABS
650.0000 mg | ORAL_TABLET | Freq: Four times a day (QID) | ORAL | Status: DC | PRN
  Administered 2018-05-10: 650 mg via ORAL
  Filled 2018-05-10: qty 2

## 2018-05-10 MED ORDER — MAGNESIUM HYDROXIDE 400 MG/5ML PO SUSP: 30.0000 mL | Freq: Every day | ORAL | Status: DC | PRN

## 2018-05-10 MED ORDER — TRAZODONE HCL 50 MG PO TABS
50.0000 mg | ORAL_TABLET | Freq: Every evening | ORAL | Status: DC | PRN
  Administered 2018-05-10: 50 mg via ORAL
  Filled 2018-05-10: qty 1

## 2018-05-10 MED ORDER — ALUM & MAG HYDROXIDE-SIMETH 200-200-20 MG/5ML PO SUSP: 30.0000 mL | ORAL | Status: DC | PRN

## 2018-05-10 MED ORDER — HYDROXYZINE HCL 25 MG PO TABS
25.0000 mg | ORAL_TABLET | Freq: Three times a day (TID) | ORAL | Status: DC | PRN
  Administered 2018-05-10: 25 mg via ORAL
  Filled 2018-05-10: qty 1
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
## 2018-05-10 NOTE — ED Notes (Signed)
Pt here for eval of suicidal ideation that she has had for months. States she got worse today and has a plan to run into traffic. She is not currently on any meds for depression, states the one she was on was an antihistamine, hx of depression and panic attacks.

## 2018-05-10 NOTE — ED Notes (Signed)
Notified [facility] that pt is ready for transport
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
## 2018-05-10 NOTE — Tx Team (Signed)
Initial Treatment Plan 05/10/2018 9:43 PM Maycie Tyler AasJ Matkins [id number]    PATIENT STRESSORS: Financial difficulties Marital or family conflict Traumatic event   PATIENT STRENGTHS: Ability for insight Active sense of humor Communication skills Supportive family/friends   PATIENT IDENTIFIED PROBLEMS: Anxiety  "to be a happier person"  "Stop falling for the wrong guys"                 DISCHARGE CRITERIA:  Ability to meet basic life and health needs Reduction of life-threatening or endangering symptoms to within safe limits Safe-care adequate arrangements made  PRELIMINARY DISCHARGE PLAN: Outpatient therapy Participate in family therapy Placement in alternative living arrangements  PATIENT/FAMILY INVOLVEMENT: This treatment plan has been presented to and reviewed with the patient, Donnamae JudeAmber J Kenneth, and/or family member.  The patient and family have been given the opportunity to ask questions and make suggestions.  Janne LabShaneka E Abron Neddo, RN 05/10/2018, 9:43 PM

## 2018-05-10 NOTE — ED Notes (Addendum)
Pt changed into burgundy scrubs, wanded by security; belongings and valuables (including phone and wallet) placed in bag to be sent home with pt's father per pt's request

## 2018-05-10 NOTE — ED Notes (Signed)
Pt speaking w/ TTS 

## 2018-05-10 NOTE — Progress Notes (Signed)
Nursing note 7p-7a  Pt isolating in room this shift post admission. Displayed a depressed affect and mood upon interaction with this Clinical research associatewriter. Pt complains of increased anxiety and depression. Pt endorses passive SI thoughts with voices that tell her to kill her self.  Pt complains of Headache pain 7/10 ,denies HI, and also denies any visual hallucinations at this time. See MAR for prn medication administration. Pt is able to verbally contract for safety with this RN. Goal: "to be happier with myself and stop falling for the wrong type of guy." Pt educated on falls and encouraged to wear nonskid socks when ambulating in the halls. Pt also encouraged to call for help if feeling weak or dizzy. Pt verbalized understanding of all education provided. Pt is now resting in bed with eyes closed, with no signs or symptoms of pain or distress noted. Pt continues to remain safe on the unit and is observed by rounding every 15 min. RN will continue to monitor.

## 2018-05-10 NOTE — ED Notes (Signed)
Regular diet ordered.
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
## 2018-05-11 DIAGNOSIS — F322 Major depressive disorder, single episode, severe without psychotic features: Principal | ICD-10-CM

## 2018-05-11 MED ORDER — LORAZEPAM 0.5 MG PO TABS
0.5000 mg | ORAL_TABLET | ORAL | Status: DC | PRN
  Administered 2018-05-12 – 2018-05-13 (×5): 0.5 mg via ORAL
  Filled 2018-05-11 (×5): qty 1

## 2018-05-11 MED ORDER — NON FORMULARY: 1.0000 | Freq: Every day | Status: DC

## 2018-05-11 MED ORDER — MIRTAZAPINE 7.5 MG PO TABS
7.5000 mg | ORAL_TABLET | Freq: Every day | ORAL | Status: DC
  Administered 2018-05-11: 7.5 mg via ORAL
  Filled 2018-05-11 (×2): qty 1

## 2018-05-11 NOTE — [facility] Group Notes (Signed)
LCSW Group Therapy Note 05/11/2018 11:53 AM  Type of Therapy and Topic: Group Therapy: Overcoming Obstacles  Participation Level: Active  Description of Group:  In this group patients will be encouraged to explore what they see as obstacles to their own wellness and recovery. They will be guided to discuss their thoughts, feelings, and behaviors related to these obstacles. The group will process together ways to cope with barriers, with attention given to specific choices patients can make. Each patient will be challenged to identify changes they are motivated to make in order to overcome their obstacles. This group will be process-oriented, with patients participating in exploration of their own experiences as well as giving and receiving support and challenge from other group members.  Therapeutic Goals: 1. Patient will identify personal and current obstacles as they relate to admission. 2. Patient will identify barriers that currently interfere with their wellness or overcoming obstacles.  3. Patient will identify feelings, thought process and behaviors related to these barriers. 4. Patient will identify two changes they are willing to make to overcome these obstacles:   Summary of Patient Progress  Samantha [name] was engaged and participated throughout the group session. Samantha [name] reports that her main obstacle is housing and stability. Samantha [name] reports that her main barriers for overcoming her obstacle is her inability to retain employment and low self esteem.     Therapeutic Modalities:  Cognitive Behavioral Therapy Solution Focused Therapy Motivational Interviewing Relapse Prevention Therapy   Samantha [name] LCSWA Clinical Social Worker
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
## 2018-05-11 NOTE — BHH Group Notes (Signed)
Adult Psychoeducational Group Note  Date:  05/11/2018 Time:  10:07 PM  Group Topic/Focus:  Wrap-Up Group:   The focus of this group is to help patients review their daily goal of treatment and discuss progress on daily workbooks.  Participation Level:  Active  Participation Quality:  Appropriate and Attentive  Affect:  Appropriate  Cognitive:  Alert and Appropriate  Insight: Appropriate and Good  Engagement in Group:  Engaged  Modes of Intervention:  Discussion and Education  Additional Comments:  Pt attended and participated in wrap up group this evening. Pt rated their day a 7/10 due to them having a bad morning nut they went outside, slept good and had a good visit. Pt did not have a goal for today, but tomorrow they want to work on a discharge plan, because they are "ready to go home".   Samantha NettersOctavia A [name] 05/11/2018, 10:07 PM

## 2018-05-11 NOTE — Progress Notes (Signed)
Patient ID: Donnamae JudeAmber J Giannetti, female   DOB: 05-18-1985, 32 y.o.   MRN: [id number]  Pt currently presents with a flat affect and guarded behavior. Pt reports to writer that their goal is to "try to feel better." Pt remains in her room for most of the day. Pt reports good sleep with current medication regimen.   Pt provided with medications per providers orders. Pt's labs and vitals were monitored throughout the night. Pt supported emotionally and encouraged to express concerns and questions. Pt educated on medications. Pt encouraged to attend groups and be a part of the milieu.   Pt's safety ensured with 15 minute and environmental checks. Pt currently endorses ongoing SI. Pt currently denies HI and A/V hallucinations. Pt verbally agrees to seek staff if SI/HI or A/VH occurs and to consult with staff before acting on any harmful thoughts. Will continue POC.
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
## 2018-05-11 NOTE — Tx Team (Signed)
Interdisciplinary Treatment and Diagnostic Plan Update  05/11/2018 Time of Session:  ENVI EAGLESON MRN: [id number]  Principal Diagnosis: <principal problem not specified>  Secondary Diagnoses: Active Problems:   MDD (major depressive disorder), severe (HCC)   Current Medications:  Current Facility-Administered Medications  Medication Dose Route Frequency Provider Last Rate Last Dose  . acetaminophen (TYLENOL) tablet 650 mg  650 mg Oral Q6H PRN Money, Lowry Ram, FNP   650 mg at 05/10/18 2104  . alum & mag hydroxide-simeth (MAALOX/MYLANTA) 200-200-20 MG/5ML suspension 30 mL  30 mL Oral Q4H PRN Money, Lowry Ram, FNP      . hydrOXYzine (ATARAX/VISTARIL) tablet 25 mg  25 mg Oral TID PRN Money, Lowry Ram, FNP   25 mg at 05/10/18 2105  . magnesium hydroxide (MILK OF MAGNESIA) suspension 30 mL  30 mL Oral Daily PRN Money, Lowry Ram, FNP      . traZODone (DESYREL) tablet 50 mg  50 mg Oral QHS PRN Money, Lowry Ram, FNP   50 mg at 05/10/18 2104   PTA Medications: Medications Prior to Admission  Medication Sig Dispense Refill Last Dose  . levonorgestrel-ethinyl estradiol (PORTIA-28) 0.15-30 MG-MCG tablet Take 1 tablet by mouth daily.   05/10/2018 at Unknown time    Patient Stressors: Financial difficulties Marital or family conflict Traumatic event  Patient Strengths: Ability for insight Active sense of humor Communication skills Supportive family/friends  Treatment Modalities: Medication Management, Group therapy, Case management,  1 to 1 session with clinician, Psychoeducation, Recreational therapy.   Physician Treatment Plan for Primary Diagnosis: <principal problem not specified> Long Term Goal(s):     Short Term Goals:    Medication Management: Evaluate patient's response, side effects, and tolerance of medication regimen.  Therapeutic Interventions: 1 to 1 sessions, Unit Group sessions and Medication administration.  Evaluation of Outcomes: Not Met  Physician Treatment Plan  for Secondary Diagnosis: Active Problems:   MDD (major depressive disorder), severe ([place])  Long Term Goal(s):     Short Term Goals:       Medication Management: Evaluate patient's response, side effects, and tolerance of medication regimen.  Therapeutic Interventions: 1 to 1 sessions, Unit Group sessions and Medication administration.  Evaluation of Outcomes: Not Met   RN Treatment Plan for Primary Diagnosis: <principal problem not specified> Long Term Goal(s): Knowledge of disease and therapeutic regimen to maintain health will improve  Short Term Goals: Ability to participate in decision making will improve, Ability to verbalize feelings will improve, Ability to disclose and discuss suicidal ideas and Ability to identify and develop effective coping behaviors will improve  Medication Management: RN will administer medications as ordered by provider, will assess and evaluate patient's response and provide education to patient for prescribed medication. RN will report any adverse and/or side effects to prescribing provider.  Therapeutic Interventions: 1 on 1 counseling sessions, Psychoeducation, Medication administration, Evaluate responses to treatment, Monitor vital signs and CBGs as ordered, Perform/monitor CIWA, COWS, AIMS and Fall Risk screenings as ordered, Perform wound care treatments as ordered.  Evaluation of Outcomes: Not Met   LCSW Treatment Plan for Primary Diagnosis: <principal problem not specified> Long Term Goal(s): Safe transition to appropriate next level of care at discharge, Engage patient in therapeutic group addressing interpersonal concerns.  Short Term Goals: Engage patient in aftercare planning with referrals and resources  Therapeutic Interventions: Assess for all discharge needs, 1 to 1 time with Social worker, Explore available resources and support systems, Assess for adequacy in community support network, Educate family and significant  other(s) on suicide  prevention, Complete Psychosocial Assessment, Interpersonal group therapy.  Evaluation of Outcomes: Not Met   Progress in Treatment: Attending groups: No. Participating in groups: No. Taking medication as prescribed: Yes. Toleration medication: Yes. Family/Significant other contact made: No, will contact:  the patient's mother Patient understands diagnosis: Yes. Discussing patient identified problems/goals with staff: Yes. Medical problems stabilized or resolved: Yes. Denies suicidal/homicidal ideation: No. Issues/concerns per patient self-inventory: No. Other:   New problem(s) identified: None   New Short Term/Long Term Goal(s): medication stabilization, elimination of SI thoughts, development of comprehensive mental wellness plan.    Patient Goals: To be a happier person and to stop falling for the wrong guys    Discharge Plan or Barriers: CSW will continue to follow and assess for appropriate referrals and possible discharge planning.   Reason for Continuation of Hospitalization: Anxiety Delusions  Medication stabilization Suicidal ideation  Estimated Length of Stay: 3-5 days   Attendees: Patient: 05/11/2018 8:51 AM  Physician: Dr. Neita Garnet, MD 05/11/2018 8:51 AM  Nursing: Clarise Cruz.Carlean Jews, RN 05/11/2018 8:51 AM  RN Care Manager: 05/11/2018 8:51 AM  Social Worker: Radonna Ricker, [place] 05/11/2018 8:51 AM  Recreational Therapist:  05/11/2018 8:51 AM  Other:  05/11/2018 8:51 AM  Other:  05/11/2018 8:51 AM  Other: 05/11/2018 8:51 AM    Scribe for Treatment Team: Marylee Floras, [place] 05/11/2018 8:51 AM

## 2018-05-11 NOTE — Progress Notes (Signed)
Recreation Therapy Notes  Date: 12.30.19 Time: 0930 Location: [address] Dayroom  Group Topic: Stress Management  Goal Area(s) Addresses:  Patient will identify stress management techniques. Patient will identify benefits of using stress management techniques post d/c.  Intervention: Stress Management  Activity :  Guided Imagery.  LRT introduced the stress management technique of guided imagery.  LRT read a scrip that guided patients on the beach to relax by the peacefully waves.  Education:  Stress Management, Discharge Planning.   Education Outcome: Acknowledges Education  Clinical Observations/Feedback:  Pt did not attend group.     Nhi Butrum, LRT/CTRS         Isabellamarie Randa A 05/11/2018 10:47 AM 
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
## 2018-05-12 DIAGNOSIS — F419 Anxiety disorder, unspecified: Secondary | ICD-10-CM

## 2018-05-12 DIAGNOSIS — F332 Major depressive disorder, recurrent severe without psychotic features: Secondary | ICD-10-CM

## 2018-05-12 LAB — TSH: TSH: 3.094 u[IU]/mL (ref 0.350–4.500)

## 2018-05-12 MED ORDER — LEVONORGESTREL-ETHINYL ESTRAD 0.15-30 MG-MCG PO TABS
1.0000 | ORAL_TABLET | Freq: Every day | ORAL | Status: DC
  Administered 2018-05-12 – 2018-05-13 (×2): 1 via ORAL

## 2018-05-12 MED ORDER — MIRTAZAPINE 15 MG PO TABS
15.0000 mg | ORAL_TABLET | Freq: Every day | ORAL | Status: DC
  Administered 2018-05-12: 15 mg via ORAL
  Filled 2018-05-12 (×3): qty 1

## 2018-05-12 NOTE — Progress Notes (Signed)
Patient ID: Samantha [name], female   DOB: 1985-12-13, 32 y.o.   MRN: [id number]  D: Patient pleasant on approach tonight. Birth control medication was brought in tonight and order obtained. Patient reports that she feels the best she has in a while tonight. Denies any active SI and contracts on the unit.  A: Staff will continues to monitor on q 15 minute checks, follow treatment plan, and give meds as ordered. R: Cooperative on the unit

## 2018-05-12 NOTE — Plan of Care (Signed)
  Problem: Education: Goal: Emotional status will improve Outcome: Progressing  DAR NOTE: Patient presents with flat affect and depressed mood.  Denies suicidal thoughts, pain, auditory and visual hallucinations.  Rates depression at 1, hopelessness at 0, and anxiety at 4.  Maintained on routine safety checks.  Medications given as prescribed.  Support and encouragement offered as needed.  Attended group and participated.  States goal for today is "working on a discharge plan."  Patient observed socializing with peers in the dayroom.  Offered no complaint.

## 2018-05-12 NOTE — [facility] Group Notes (Signed)
Type of Therapy/Topic: Group Therapy: Emotion Regulation  Participation Level: Active   Description of Group:  The purpose of this group is to assist patients in learning to regulate negative emotions and experience positive emotions. Patients will be guided to discuss ways in which they have been vulnerable to their negative emotions. These vulnerabilities will be juxtaposed with experiences of positive emotions or situations, and patients will be challenged to use positive emotions to combat negative ones. Special emphasis will be placed on coping with negative emotions in conflict situations, and patients will process healthy conflict resolution skills.  Therapeutic Goals: 1. Patient will identify two positive emotions or experiences to reflect on in order to balance out negative emotions 2. Patient will label two or more emotions that they find the most difficult to experience 3. Patient will demonstrate positive conflict resolution skills through discussion and/or role plays  Summary of Patient Progress:  Joice Loftsmber was engaged and participated throughout the group session. Etta reports that she struggles with anger. Elgene states that she realizes that when she is menstruating that is the only time she feels "overly angry".    Therapeutic Modalities:  Cognitive Behavioral Therapy Feelings Identification Dialectical Behavioral Therapy   Alcario DroughtJolan Lyfe Monger LCSWA Clinical Social Worker
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
## 2018-05-12 NOTE — Progress Notes (Signed)
[facility]  05/12/2018 11:49 AM Samantha [name]  MRN:  [id number] Subjective: Patient is seen and examined.  Patient is a 32 year old female with a past psychiatric history significant for depression and anxiety who developed suicidal ideation after recent argument with the boyfriend.  Objective: Patient is seen and examined.  Patient is a 32 year old female with the above-stated past psychiatric history who is seen in follow-up.  She stated she is feeling better.  She stated her mood is much improved.  She denied any current suicidal ideation.  She stated that the mirtazapine has helped her sleep a bit, but not greatly.  On admission her drug screen was positive for benzodiazepines as well as cannabis.  She stated she took 1 Xanax tablet, but this had been isolated.  We discussed potentially increasing the mirtazapine for anxiety and depression.  She stated she was willing to take the medication during the course of the hospitalization, but most likely would stop it after discharge.  She stated she is not having any suicidal thoughts currently, and just realized that it was just a great shock about the argument and the realization that she was going to have to move home with her daughters.  Her vital signs are stable, she is afebrile.  Nursing notes reflect that she slept 5 hours last night.  She denied any suicidal ideation.  Principal Problem: <principal problem not specified> Diagnosis: Active Problems:   MDD (major depressive disorder), severe (HCC)  Total Time spent with patient: 20 minutes  Past Psychiatric History: See admission H&P  Past Medical History:  Past Medical History:  Diagnosis Date  . Anxiety    panic attacks  . Complication of anesthesia    woke up having a panic attack  . Headache    occ migraines  . History of gestational diabetes   . Panic disorder     Past Surgical History:  Procedure Laterality Date  . fallopian tube removal    . MOUTH SURGERY    .  TUBAL LIGATION     Family History:  Family History  Problem Relation Age of Onset  . Osteoporosis Mother   . COPD Father   . COPD Paternal Grandmother    Family Psychiatric  History: See admission H&P Social History:  Social History   Substance and Sexual Activity  Alcohol Use Yes  . Alcohol/week: 1.0 standard drinks  . Types: 1 Standard drinks or equivalent per week     Social History   Substance and Sexual Activity  Drug Use Yes  . Types: Marijuana    Social History   Socioeconomic History  . Marital status: Single    Spouse name: Not on file  . Number of children: 4  . Years of education: Not on file  . Highest education level: Not on file  Occupational History  . Occupation: Stay at home mother  Social Needs  . Financial resource strain: Hard  . Food insecurity:    Worry: Never true    Inability: Never true  . Transportation needs:    Medical: No    Non-medical: No  Tobacco Use  . Smoking status: Current Some Day Smoker    Packs/day: 0.10    Years: 15.00    Pack years: 1.50    Types: Cigarettes  . Smokeless tobacco: Never Used  Substance and Sexual Activity  . Alcohol use: Yes    Alcohol/week: 1.0 standard drinks    Types: 1 Standard drinks or equivalent per week  .  Drug use: Yes    Types: Marijuana  . Sexual activity: Yes    Birth control/protection: None  Lifestyle  . Physical activity:    Days per week: Not on file    Minutes per session: Not on file  . Stress: Very much  Relationships  . Social connections:    Talks on phone: Not on file    Gets together: Not on file    Attends religious service: Not on file    Active member of club or organization: Not on file    Attends meetings of clubs or organizations: Not on file    Relationship status: Not on file  Other Topics Concern  . 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 acetaminophen (TYLENOL) tablet 650 mg  650 mg Oral Q6H PRN Money, Gerlene Burdock, FNP   650 mg at 05/10/18 2104  . alum & mag hydroxide-simeth (MAALOX/MYLANTA) 200-200-20 MG/5ML suspension 30 mL  30 mL Oral Q4H PRN Money, Gerlene Burdock, FNP      . levonorgestrel-ethinyl estradiol (NORDETTE) 0.15-30 MG-MCG per tablet 1 tablet  1 tablet Oral Daily Cobos, Rockey Situ, MD      . LORazepam (ATIVAN) tablet 0.5 mg  0.5 mg Oral Q4H PRN Cobos, Rockey Situ, MD      . magnesium hydroxide (MILK OF MAGNESIA) suspension 30 mL  30 mL Oral Daily PRN Money, Feliz Beam B, FNP      . mirtazapine (REMERON) tablet 15 mg  15 mg Oral QHS Antonieta Pert, MD        Lab Results:  Results for orders placed or performed during the hospital encounter of 05/10/18 (from the past 48 hour(s))  TSH     Status: None   Collection Time: 05/12/18  6:39 AM  Result Value Ref Range   TSH 3.094 0.350 - 4.500 uIU/mL    Comment: Performed by a 3rd Generation assay with a functional sensitivity of <=0.01 uIU/mL. Performed at [facility], [address] [place], [place] [address]     Blood Alcohol level:  No results found for: [facility]  Metabolic Disorder Labs: No results found for: HGBA1C, MPG No results found for: PROLACTIN No results found for: CHOL, TRIG, HDL, CHOLHDL, VLDL, LDLCALC  Physical Findings: AIMS: Facial and Oral Movements Muscles of Facial Expression: None, normal Lips and Perioral Area: None, normal Jaw: None, normal Tongue: None, normal,Extremity Movements Upper (arms, wrists, hands, fingers): None, normal Lower (legs, knees, ankles, toes): None, normal, Trunk  Movements Neck, shoulders, hips: None, normal, Overall Severity Severity of abnormal movements (highest score from questions above): None, normal Incapacitation due to abnormal movements: None, normal Patient's awareness of abnormal movements (rate only patient's report): No Awareness, Dental Status Current problems with teeth and/or dentures?: Yes Does patient usually wear dentures?: No  CIWA:  CIWA-Ar Total: 0 COWS:  COWS Total Score: 0  Musculoskeletal: Strength & Muscle Tone: within normal limits Gait & Station: normal Patient leans: N/A  Psychiatric Specialty Exam: Physical Exam  Nursing note and vitals reviewed. Constitutional: She is oriented to person, place, and time. She appears well-developed and well-nourished.  HENT:  Head: Normocephalic and atraumatic.  Respiratory: Effort normal.  Neurological: She is alert and oriented to person, place, and time.    ROS  Blood pressure 95/77, pulse 75, temperature 98.6 F (37 C), temperature source Oral, resp. rate 20, height 5\' 6"  (1.676 m), weight 46.3 kg, last menstrual period 04/19/2018, not currently breastfeeding.Body mass index is 16.46 kg/m.  General Appearance: Casual  Eye Contact:  Good  Speech:  Normal Rate  Volume:  Normal  Mood:  Anxious  Affect:  Congruent  Thought Process:  Coherent and Descriptions of Associations: Intact  Orientation:  Full (Time, Place, and Person)  Thought Content:  Logical  Suicidal Thoughts:  No  Homicidal Thoughts:  No  Memory:  Immediate;   Fair Recent;   Fair Remote;   Fair  Judgement:  Intact  Insight:  Fair  Psychomotor Activity:  Normal  Concentration:  Concentration: Fair and Attention Span: Fair  Recall:  [employer]Fair  Fund of Knowledge:  Fair  Language:  Good  Akathisia:  Negative  Handed:  Right  AIMS (if indicated):     Assets:  Communication Skills Desire for Improvement Housing Leisure Time Physical Health Resilience Social Support  ADL's:  Intact  Cognition:  WNL   Sleep:  Number of Hours: 5     Treatment Plan Summary: Daily contact with patient to assess and evaluate symptoms and progress in treatment, Medication management and Plan : Patient is seen and examined.  Patient is a 32 year old female with the above-stated past psychiatric history who is seen in follow-up.  She continues to improve.  She denied any suicidal ideation today, and she stated her anxiety and depression were improved as well. 1.  Increase mirtazapine to 15 mg p.o. nightly.  This is for mood and anxiety as well as sleep. 2.  Continue lorazepam 0.5 mg p.o. every 4 hours as needed anxiety. 3.  Continue birth control pills as written. 4.  Disposition planning-anticipate discharge in 1 to 2 days.  Antonieta PertGreg Lawson Clary, MD 05/12/2018, 11:49 AM

## 2018-05-12 NOTE — [facility] Group Notes (Signed)
[facility] Group Notes:  (Nursing/MHT/Case Management/Adjunct)  Date:  05/12/2018  Time:  3:20 PM  Type of Therapy:  Psychoeducational Skills  Participation Level:  Active  Participation Quality:  Appropriate and Attentive  Affect:  Appropriate  Cognitive:  Alert and Appropriate  Insight:  Appropriate and Good  Engagement in Group:  Engaged  Modes of Intervention:  Discussion and Education  Summary of Progress/Problems: Patient was attentive and actively participated.    Audrie Lializabeth O Cadin Luka 05/12/2018, 3:20 PM

## 2018-05-12 NOTE — BHH Group Notes (Signed)
Adult Psychoeducational Group Note  Date:  05/12/2018 Time:  9:50 PM  Group Topic/Focus:  Wrap-Up Group:   The focus of this group is to help patients review their daily goal of treatment and discuss progress on daily workbooks.  Participation Level:  Active  Participation Quality:  Appropriate and Attentive  Affect:  Appropriate  Cognitive:  Alert and Appropriate  Insight: Appropriate and Good  Engagement in Group:  Engaged  Modes of Intervention:  Discussion and Education  Additional Comments:  Pt attended and participated in wrap up group this evening. Pt rated their day an 8/10, due to them having some sadness but was able to bring themselves up from that. Pt goal was to work on a discharge plan, which is an ongoing goal for the pt.   Samantha NettersOctavia A Kaysia Willard 05/12/2018, 9:50 PM

## 2018-05-13 MED ORDER — MIRTAZAPINE 15 MG PO TABS: 15.0000 mg | ORAL_TABLET | Freq: Every day | ORAL | 0 refills | Status: AC

## 2018-05-13 MED ORDER — HYDROXYZINE HCL 10 MG PO TABS: 10.0000 mg | ORAL_TABLET | Freq: Three times a day (TID) | ORAL | Status: DC | PRN

## 2018-05-13 MED ORDER — HYDROXYZINE HCL 10 MG PO TABS: 10.0000 mg | ORAL_TABLET | Freq: Three times a day (TID) | ORAL | 0 refills | Status: AC | PRN

## 2018-05-13 NOTE — Progress Notes (Signed)
Patient ID: Samantha [name], female   DOB: 1985/09/06, 33 y.o.   MRN: [id number]  D: Pt alert and oriented on the unit.   A: Education, support, and encouragement provided. Discharge summary, medications and follow up appointments reviewed with pt. Suicide prevention resources provided, including "My 3 App." Pt signed belonging sheet stating that she had no belongings in locker and no locker assigned.  R: Pt denies SI/HI, A/VH, pain, or any concerns at this time. Pt ambulatory on and off unit. Pt discharged to lobby.

## 2018-05-13 NOTE — Discharge Summary (Signed)
Physician Discharge Summary Note  Patient:  Samantha [name] is an 33 y.o., female MRN:  [id number] DOB:  [birth year] Patient phone:  [phone] (home)  Patient address:   [address] [place] [place] [address],  Total Time spent with patient: 15 minutes  Date of Admission:  05/10/2018 Date of Discharge: 05/13/2018  Reason for Admission:  Suicidal ideation  Principal Problem: MDD (major depressive disorder), severe (HCC) Discharge Diagnoses: Principal Problem:   MDD (major depressive disorder), severe (HCC)   Past Psychiatric History: From admission SRA: reports she has history of Anxiety, Depression, and has been diagnosed with GAD and Panic Disorder in the past . Does not endorse history of mania. Denies history of psychosis. History of a prior suicide attempt by overdosing as a teenager.  Past Medical History:  Past Medical History:  Diagnosis Date  . Anxiety    panic attacks  . Complication of anesthesia    woke up having a panic attack  . Headache    occ migraines  . History of gestational diabetes   . Panic disorder     Past Surgical History:  Procedure Laterality Date  . fallopian tube removal    . MOUTH SURGERY    . TUBAL LIGATION     Family History:  Family History  Problem Relation Age of Onset  . Osteoporosis Mother   . COPD Father   . COPD Paternal Grandmother    Family Psychiatric  History: From admission H&P: Schizophrenia: Father.  Social History:  Social History   Substance and Sexual Activity  Alcohol Use Yes  . Alcohol/week: 1.0 standard drinks  . Types: 1 Standard drinks or equivalent per week     Social History   Substance and Sexual Activity  Drug Use Yes  . Types: Marijuana    Social History   Socioeconomic History  . Marital status: Single    Spouse name: Not on file  . Number of children: 4  . Years of education: Not on file  . Highest education level: Not on file  Occupational History  . Occupation: Stay at home mother   Social Needs  . Financial resource strain: Hard  . Food insecurity:    Worry: Never true    Inability: Never true  . Transportation needs:    Medical: No    Non-medical: No  Tobacco Use  . Smoking status: Current Some Day Smoker    Packs/day: 0.10    Years: 15.00    Pack years: 1.50    Types: Cigarettes  . Smokeless tobacco: Never Used  Substance and Sexual Activity  . Alcohol use: Yes    Alcohol/week: 1.0 standard drinks    Types: 1 Standard drinks or equivalent per week  . Drug use: Yes    Types: Marijuana  . Sexual activity: Yes    Birth control/protection: None  Lifestyle  . Physical activity:    Days per week: Not on file    Minutes per session: Not on file  . Stress: Very much  Relationships  . Social connections:    Talks on phone: Not on file    Gets together: Not on file    Attends religious service: Not on file    Active member of club or organization: Not on file    Attends meetings of clubs or organizations: Not on file    Relationship status: Not on file  Other Topics Concern  . 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 Homemaker. Patient presented to ED yesterday with her parents due to worsening depression, anxiety, suicidal ideations , with thoughts of cutting herself . She reports increased tension and recent argument with BF due to his abusing drugs. States he was recently escorted out of a hotel they had been staying in. States that as she had been living with her BF prior to admission, and she is currently homeless. Patient then went to her parents' home and reports "I was a mess, very depressed". Endorses some neuro-vegetative symptoms: anhedonia, poor energy level, poor sleep, intermittent ( passive ) SI. Denies psychotic symptoms. Admission  UDS positive for BZD and Cannabis. Patient reports she took one Xanax tablet yesterday but states this was isolated and denies any BZD abuse. Was not taking any psychiatric medications prior to admission.  Samantha [name] was admitted for suicidal ideation after breaking up with her live-in boyfriend. She and her 4 children had been living with this boyfriend, so the break-up made them homeless. The children stayed with her parents during hospitalization, and she is going to her parents' home on discharge. She remained on the [facility] unit for 3 days. She stabilized on medication and therapy. She was discharged on Remeron and Vistaril. She has shown improvement with improved mood, affect, sleep, appetite, and interaction. Patient denies any SI/HI/AVH and contracts for safety. Patient agrees to follow up at [facility]. She was provided with prescriptions for medications upon discharge.   Physical Findings: AIMS: Facial and Oral Movements Muscles of Facial Expression: None, normal Lips and Perioral Area: None, normal Jaw: None, normal Tongue: None, normal,Extremity Movements Upper (arms, wrists, hands, fingers): None, normal Lower (legs, knees, ankles, toes): None, normal, Trunk Movements Neck, shoulders, hips: None, normal, Overall Severity Severity of abnormal movements (highest score from questions above): None, normal Incapacitation due to abnormal movements: None, normal Patient's awareness of abnormal movements (rate only patient's report): No Awareness, Dental Status Current problems with teeth and/or dentures?: Yes Does patient usually wear dentures?: No  CIWA:  CIWA-Ar Total: 0 COWS:  COWS Total Score: 0  Musculoskeletal: Strength & Muscle Tone: within normal limits Gait & Station: normal Patient leans: N/A  Psychiatric Specialty Exam: Physical Exam  Nursing note and vitals reviewed. Constitutional: She is oriented to person, place, and time. She appears well-developed and well-nourished.   Cardiovascular: Normal rate.  Respiratory: Effort normal.  Neurological: She is alert and oriented to person, place, and time.    Review of Systems  Constitutional: Negative.   Respiratory: Negative.   Cardiovascular: Negative.   Neurological: Negative.   Psychiatric/Behavioral: Positive for substance abuse (UDS (+) BZDs, THC). Negative for depression, hallucinations, memory loss and suicidal ideas. The patient is nervous/anxious (improving). The patient does not have insomnia.     Blood pressure (!) 116/92, pulse 90, temperature 98.2 F (36.8 C), temperature source Oral, resp. rate 16, height 5\' 6"  (1.676 m), weight 46.3 kg, last menstrual period 04/19/2018, not currently breastfeeding.Body mass index is 16.46 kg/m.  See MD's discharge SRA     Have you used any form of tobacco in the last 30 days? (Cigarettes, Smokeless Tobacco, Cigars, and/or Pipes): Yes  Has this patient used any form of tobacco in the last 30 days? (Cigarettes, Smokeless Tobacco, Cigars, and/or Pipes) No  Blood Alcohol level:  No  results found for: [facility]  Metabolic Disorder Labs:  No results found for: HGBA1C, MPG No results found for: PROLACTIN No results found for: CHOL, TRIG, HDL, CHOLHDL, VLDL, LDLCALC  See Psychiatric Specialty Exam and Suicide Risk Assessment completed by Attending Physician prior to discharge.  Discharge destination:  Home  Is patient on multiple antipsychotic therapies at discharge:  No   Has Patient had three or more failed trials of antipsychotic monotherapy by history:  No  Recommended Plan for Multiple Antipsychotic Therapies: NA  Discharge Instructions    Discharge instructions   Complete by:  As directed    Patient is instructed to take all prescribed medications as recommended. Report any side effects or adverse reactions to your outpatient psychiatrist. Patient is instructed to abstain from alcohol and illegal drugs while on prescription medications. In the event of  worsening symptoms, patient is instructed to call the crisis hotline, 911, or go to the nearest emergency department for evaluation and treatment.     Allergies as of 05/13/2018      Reactions   Milk-related Compounds Nausea And Vomiting      Medication List    TAKE these medications     Indication  hydrOXYzine 10 MG tablet Commonly known as:  ATARAX/VISTARIL Take 1 tablet (10 mg total) by mouth 3 (three) times daily as needed for anxiety.  Indication:  Feeling Anxious   mirtazapine 15 MG tablet Commonly known as:  REMERON Take 1 tablet (15 mg total) by mouth at bedtime.  Indication:  Major Depressive Disorder   PORTIA-28 0.15-30 MG-MCG tablet Generic drug:  levonorgestrel-ethinyl estradiol Take 1 tablet by mouth daily.  Indication:  Birth Control Treatment      Follow-up [employer]    [facility]. Go on 05/19/2018.   Specialty:  Behavioral Health Why:  Your hospital follow up appointment is Tuesday, 05/19/18 at 8:30a.  Please bring: photo ID, proof of insurance, social security card, and any discharge paperwork from this hospitalization.  Contact informationElpidio Eric: [address] [place] [address] [phone]           Follow-up recommendations: Activity as tolerated. Diet as recommended by primary care physician. Keep all scheduled follow-up appointments as recommended.   Comments:   Patient is instructed to take all prescribed medications as recommended. Report any side effects or adverse reactions to your outpatient psychiatrist. Patient is instructed to abstain from alcohol and illegal drugs while on prescription medications. In the event of worsening symptoms, patient is instructed to call the crisis hotline, 911, or go to the nearest emergency department for evaluation and treatment.  Signed: Aldean BakerJanet E Tasha Diaz, NP 05/13/2018, 10:13 AM

## 2018-05-13 NOTE — BHH Suicide Risk Assessment (Signed)
[facility] Discharge Suicide Risk Assessment   Principal Problem: <principal problem not specified> Discharge Diagnoses: Active Problems:   MDD (major depressive disorder), severe (HCC)   Total Time spent with patient: 20 minutes  Musculoskeletal: Strength & Muscle Tone: within normal limits Gait & Station: normal Patient leans: N/A  Psychiatric Specialty Exam: Review of Systems  All other systems reviewed and are negative.   Blood pressure (!) 116/92, pulse 90, temperature 98.2 F (36.8 C), temperature source Oral, resp. rate 16, height 5\' 6"  (1.676 m), weight 46.3 kg, last menstrual period 04/19/2018, not currently breastfeeding.Body mass index is 16.46 kg/m.  General Appearance: Casual  Eye Contact::  Good  Speech:  Normal Rate409  Volume:  Normal  Mood:  Anxious  Affect:  Congruent  Thought Process:  Coherent and Descriptions of Associations: Intact  Orientation:  Full (Time, Place, and Person)  Thought Content:  Logical  Suicidal Thoughts:  No  Homicidal Thoughts:  No  Memory:  Immediate;   Fair Recent;   Fair Remote;   Fair  Judgement:  Intact  Insight:  Fair  Psychomotor Activity:  Increased  Concentration:  Good  Recall:  Good  Fund of Knowledge:Good  Language: Good  Akathisia:  Negative  Handed:  Right  AIMS (if indicated):     Assets:  Communication Skills Desire for Improvement Financial Resources/Insurance Housing Leisure Time Physical Health Resilience Social Support  Sleep:  Number of Hours: 5  Cognition: WNL  ADL's:  Intact   Mental Status Per Nursing Assessment::   On Admission:  Self-harm thoughts, Belief that plan would result in death  Demographic Factors:  Divorced or widowed, Caucasian, Low socioeconomic status and Unemployed  Loss Factors: Loss of significant relationship  Historical Factors: Impulsivity  Risk Reduction Factors:   Responsible for children under 5 years of age, Sense of responsibility to family, Living with another  person, especially a relative and Positive social support  Continued Clinical Symptoms:  Severe Anxiety and/or Agitation Depression:   Impulsivity  Cognitive Features That Contribute To Risk:  None    Suicide Risk:  Minimal: No identifiable suicidal ideation.  Patients presenting with no risk factors but with morbid ruminations; may be classified as minimal risk based on the severity of the depressive symptoms  Follow-up Information    [facility]. Go on 05/19/2018.   Specialty:  Behavioral Health Why:  Your hospital follow up appointment is Tuesday, 05/19/18 at 8:30a.  Please bring: photo ID, proof of insurance, social security card, and any discharge paperwork from this hospitalization.  Contact information: [address] [place] [address] [phone]           Plan Of Care/Follow-up recommendations:  Activity:  ad lib  Antonieta Pert, MD 05/13/2018, 10:07 AM

## 2018-12-24 ENCOUNTER — Other Ambulatory Visit: Payer: Self-pay

## 2018-12-24 ENCOUNTER — Encounter (HOSPITAL_COMMUNITY): Payer: Self-pay | Admitting: Emergency Medicine

## 2018-12-24 ENCOUNTER — Ambulatory Visit (HOSPITAL_COMMUNITY)
Admission: EM | Admit: 2018-12-24 | Discharge: 2018-12-24 | Disposition: A | Discharge status: Home / Self Care | Payer: Medicaid Other | Attending: Family Medicine | Admitting: Family Medicine

## 2018-12-24 DIAGNOSIS — N760 Acute vaginitis: Secondary | ICD-10-CM | POA: Diagnosis not present

## 2018-12-24 DIAGNOSIS — Z3202 Encounter for pregnancy test, result negative: Secondary | ICD-10-CM | POA: Diagnosis not present

## 2018-12-24 LAB — POCT URINALYSIS DIP (DEVICE)
Bilirubin Urine: NEGATIVE
Glucose, UA: NEGATIVE mg/dL
Ketones, ur: NEGATIVE mg/dL
Leukocytes,Ua: NEGATIVE
Nitrite: NEGATIVE
Protein, ur: NEGATIVE mg/dL
Specific Gravity, Urine: >=1.03 (ref 1.005–1.030)
Urobilinogen, UA: 0.2 mg/dL (ref 0.0–1.0)
pH: 5.5 (ref 5.0–8.0)

## 2018-12-24 LAB — POCT PREGNANCY, URINE: Preg Test, Ur: NEGATIVE

## 2018-12-24 MED ORDER — FLUCONAZOLE 150 MG PO TABS: 150.0000 mg | ORAL_TABLET | Freq: Every day | ORAL | 0 refills | Status: AC

## 2018-12-24 NOTE — ED Triage Notes (Signed)
Pt here for vaginal discharge that she thinks could be a yeast infection

## 2018-12-24 NOTE — ED Provider Notes (Signed)
[facility]    CSN: [id number] Arrival date & time: 12/24/18  1010     History   Chief Complaint Chief Complaint  Patient presents with  . Appointment    1010  . Vaginal Discharge    HPI Harrie Jeansmber J Dan HumphreysWalker is a 33 y.o. female.   Patient is a 33 year old female who presents today with vaginal discharge, itching, irritation and swelling.  Symptoms have been constant and worsening over the past 3 to 4 days.  She has tried over-the-counter Monistat and coconut oil which she believes made her symptoms worse.  She has had some blood-tinged with wiping.  There is some concern for STDs.  She is currently in a monogamous relationship.  Reporting that she is on oral contraceptives and her next menstrual cycle is due on 12/29/2018.  Last menstrual cycle was 1 month ago.  Denies any dysuria, hematuria or urinary frequency.  Denies any abdominal pain, back pain or fevers.  ROS per HPI      Past Medical History:  Diagnosis Date  . Anxiety    panic attacks  . Complication of anesthesia    woke up having a panic attack  . Headache    occ migraines  . History of gestational diabetes   . Panic disorder     Patient Active Problem List   Diagnosis Date Noted  . MDD (major depressive disorder), severe (HCC) 05/10/2018  . History of left salpingo-oophorectomy 10/18/2015  . H/O abnormal cervical Papanicolaou smear - +HPV, colpo 05/25/15 10/18/2015  . Vaginal delivery 10/18/2015  . Panic attacks 04/21/2015  . History of gestational diabetes 04/21/2015  . Depression 04/21/2015  . Anxiety 04/21/2015  . Smoker 04/21/2015    Past Surgical History:  Procedure Laterality Date  . fallopian tube removal    . MOUTH SURGERY    . TUBAL LIGATION      OB History    Gravida  4   Para  4   Term  4   Preterm      AB      Living  4     SAB      TAB      Ectopic      Multiple  0   Live Births  4            Home Medications    Prior to Admission medications    Medication Sig Start Date End Date Taking? Authorizing Provider  fluconazole (DIFLUCAN) 150 MG tablet Take 1 tablet (150 mg total) by mouth daily. 12/24/18   Dahlia ByesBast, Denys Labree A, NP  hydrOXYzine (ATARAX/VISTARIL) 10 MG tablet Take 1 tablet (10 mg total) by mouth 3 (three) times daily as needed for anxiety. 05/13/18   Aldean BakerSykes, Janet E, NP  levonorgestrel-ethinyl estradiol (PORTIA-28) 0.15-30 MG-MCG tablet Take 1 tablet by mouth daily.     [provider]  mirtazapine (REMERON) 15 MG tablet Take 1 tablet (15 mg total) by mouth at bedtime. 05/13/18   Aldean BakerSykes, Janet E, NP    Family History Family History  Problem Relation Age of Onset  . Osteoporosis Mother   . COPD Father   . COPD Paternal Grandmother     Social History Social History   Tobacco Use  . Smoking status: Current Some Day Smoker    Packs/day: 0.10    Years: 15.00    Pack years: 1.50    Types: Cigarettes  . Smokeless tobacco: Never Used  Substance Use Topics  . Alcohol use: Yes  Alcohol/week: 1.0 standard drinks    Types: 1 Standard drinks or equivalent per week  . Drug use: Yes    Types: Marijuana     Allergies   Milk-related compounds   Review of Systems Review of Systems   Physical Exam Triage Vital Signs ED Triage Vitals  Enc Vitals Group     BP 12/24/18 1040 117/68     Pulse Rate 12/24/18 1040 60     Resp 12/24/18 1040 18     Temp 12/24/18 1040 98.5 F (36.9 C)     Temp Source 12/24/18 1040 Oral     SpO2 12/24/18 1040 100 %     Weight --      Height --      Head Circumference --      Peak Flow --      Pain Score 12/24/18 1041 2     Pain Loc --      Pain Edu? --      Excl. in GC? --    No data found.  Updated Vital Signs BP 117/68 (BP Location: Left Arm)   Pulse 60   Temp 98.5 F (36.9 C) (Oral)   Resp 18   SpO2 100%   Visual Acuity Right Eye Distance:   Left Eye Distance:   Bilateral Distance:    Right Eye Near:   Left Eye Near:    Bilateral Near:     Physical Exam Vitals  signs and nursing note reviewed.  Constitutional:      General: She is not in acute distress.    Appearance: Normal appearance. She is not ill-appearing, toxic-appearing or diaphoretic.  HENT:     Head: Normocephalic.     Nose: Nose normal.     Mouth/Throat:     Pharynx: Oropharynx is clear.  Eyes:     Conjunctiva/sclera: Conjunctivae normal.  Neck:     Musculoskeletal: Normal range of motion.  Pulmonary:     Effort: Pulmonary effort is normal.  Abdominal:     Palpations: Abdomen is soft.     Tenderness: There is no abdominal tenderness.  Musculoskeletal: Normal range of motion.  Skin:    General: Skin is warm and dry.     Findings: No rash.  Neurological:     Mental Status: She is alert.  Psychiatric:        Mood and Affect: Mood normal.      UC Treatments / Results  Labs (all labs ordered are listed, but only abnormal results are displayed) Labs Reviewed  POCT URINALYSIS DIP (DEVICE) - Abnormal; Notable for the following components:      Result Value   Hgb urine dipstick TRACE (*)    All other components within normal limits  POC URINE PREG, ED  POCT PREGNANCY, URINE  CERVICOVAGINAL ANCILLARY ONLY    EKG   Radiology No results found.  Procedures Procedures (including critical care time)  Medications Ordered in UC Medications - No data to display  Initial Impression / Assessment and Plan / UC Course  I have reviewed the triage vital signs and the nursing notes.  Pertinent labs & imaging results that were available during my care of the patient were reviewed by me and considered in my medical decision making (see chart for details).     Urine negative for pregnancy and infection. Sending self swab for testing.  Based on symptoms we will go ahead and treat for yeast infection.  Diflucan sent to the pharmacy. Labs pending and will call  with any positive results.  Final Clinical Impressions(s) / UC Diagnoses   Final diagnoses:  Acute vaginitis      Discharge Instructions     Urine was negative for pregnancy or infection. We are sending your swab for testing and will call you with any positive results. We will go ahead and treat you with Diflucan for yeast infection based on your symptoms Follow up as needed for continued or worsening symptoms     ED Prescriptions    Medication Sig Dispense Auth. Provider   fluconazole (DIFLUCAN) 150 MG tablet Take 1 tablet (150 mg total) by mouth daily. 2 tablet Loura Halt A, NP     Controlled Substance Prescriptions [place] Controlled Substance Registry consulted? Not Applicable   Orvan July, NP 12/24/18 1130

## 2018-12-24 NOTE — Discharge Instructions (Signed)
Urine was negative for pregnancy or infection. We are sending your swab for testing and will call you with any positive results. We will go ahead and treat you with Diflucan for yeast infection based on your symptoms Follow up as needed for continued or worsening symptoms

## 2018-12-26 LAB — CERVICOVAGINAL ANCILLARY ONLY
Bacterial vaginitis: POSITIVE — AB
Candida vaginitis: POSITIVE — AB
Chlamydia: NEGATIVE
Neisseria Gonorrhea: NEGATIVE
Trichomonas: NEGATIVE

## 2018-12-28 ENCOUNTER — Telehealth (HOSPITAL_COMMUNITY): Payer: Self-pay | Admitting: Emergency Medicine

## 2018-12-28 MED ORDER — METRONIDAZOLE 500 MG PO TABS: 500.0000 mg | ORAL_TABLET | Freq: Two times a day (BID) | ORAL | 0 refills | Status: AC

## 2018-12-28 NOTE — Telephone Encounter (Signed)
Bacterial vaginosis is positive. This was not treated at the urgent care visit.  Flagyl 500 mg BID x 7 days #14 no refills sent to patients pharmacy of choice.    Candida (yeast) is positive.  Prescription for fluconazole was given at the urgent care visit.    Attempted to reach patient. No answer at this time. Mailbox full.

## 2018-12-30 ENCOUNTER — Telehealth (HOSPITAL_COMMUNITY): Payer: Self-pay | Admitting: Emergency Medicine

## 2018-12-30 NOTE — Telephone Encounter (Signed)
Attempted to reach patient x2. Family member answered the phone and would have her call back.

## 2018-12-31 ENCOUNTER — Telehealth (HOSPITAL_COMMUNITY): Payer: Self-pay | Admitting: Emergency Medicine

## 2018-12-31 NOTE — Telephone Encounter (Signed)
Patient contacted and made aware of swab results, all questions answered.   
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
## 2020-01-08 ENCOUNTER — Ambulatory Visit: Payer: Medicaid Other | Attending: Internal Medicine

## 2020-01-08 DIAGNOSIS — Z23 Encounter for immunization: Secondary | ICD-10-CM

## 2020-01-08 NOTE — Progress Notes (Signed)
   Covid-19 Vaccination Clinic  Name:  Samantha [name]    MRN: [id number] DOB: [birth year]  01/08/2020  [name] was observed post Covid-19 immunization for 15 minutes without incident. She was provided with Vaccine Information Sheet and instruction to access the V-Safe system.   [name] was instructed to call 911 with any severe reactions post vaccine: Marland Kitchen Difficulty breathing  . Swelling of face and throat  . A fast heartbeat  . A bad rash all over body  . Dizziness and weakness   Immunizations Administered    Name Date Dose VIS Date Route   Pfizer COVID-19 Vaccine 01/08/2020 12:55 PM 0.3 mL 07/07/2018 Intramuscular   Manufacturer: [employer], [employer]   Lot: [id number]   NDC: [address]-0

## 2020-01-31 ENCOUNTER — Ambulatory Visit: Payer: Medicaid Other | Attending: Internal Medicine

## 2020-01-31 DIAGNOSIS — Z23 Encounter for immunization: Secondary | ICD-10-CM

## 2020-01-31 NOTE — Progress Notes (Signed)
° °  Covid-19 Vaccination Clinic  Name:  Samantha [name]    MRN: [id number] DOB: [birth year]  01/31/2020  [name] was observed post Covid-19 immunization for 15 minutes without incident. She was provided with Vaccine Information Sheet and instruction to access the V-Safe system. Vaccinated by Fanny Bien.  [name] was instructed to call 911 with any severe reactions post vaccine:  Difficulty breathing   Swelling of face and throat   A fast heartbeat   A bad rash all over body   Dizziness and weakness   Immunizations Administered    Name Date Dose VIS Date Route   Pfizer COVID-19 Vaccine 01/31/2020 11:19 AM 0.3 mL 07/07/2018 Intramuscular   Manufacturer: [employer], [employer]   Lot: [id number]   NDC: [id number]

## 2020-02-04 MED FILL — PFIZER-BIONTECH COVID-19 VA: 30 | 1 days supply | Qty: 0 | Fill #0

## 2020-07-14 DIAGNOSIS — Z0389 Encounter for observation for other suspected diseases and conditions ruled out: Secondary | ICD-10-CM | POA: Diagnosis not present

## 2020-07-14 DIAGNOSIS — Z1388 Encounter for screening for disorder due to exposure to contaminants: Secondary | ICD-10-CM | POA: Diagnosis not present

## 2020-07-14 DIAGNOSIS — N760 Acute vaginitis: Secondary | ICD-10-CM | POA: Diagnosis not present

## 2020-07-14 DIAGNOSIS — Z113 Encounter for screening for infections with a predominantly sexual mode of transmission: Secondary | ICD-10-CM | POA: Diagnosis not present

## 2020-07-14 DIAGNOSIS — N766 Ulceration of vulva: Secondary | ICD-10-CM | POA: Diagnosis not present

## 2020-07-14 DIAGNOSIS — Z114 Encounter for screening for human immunodeficiency virus [HIV]: Secondary | ICD-10-CM | POA: Diagnosis not present

## 2020-07-14 DIAGNOSIS — Z3009 Encounter for other general counseling and advice on contraception: Secondary | ICD-10-CM | POA: Diagnosis not present

## 2021-01-29 DIAGNOSIS — T50904A Poisoning by unspecified drugs, medicaments and biological substances, undetermined, initial encounter: Secondary | ICD-10-CM | POA: Diagnosis not present

## 2021-01-29 DIAGNOSIS — Z79899 Other long term (current) drug therapy: Secondary | ICD-10-CM | POA: Diagnosis not present

## 2021-02-14 ENCOUNTER — Ambulatory Visit (HOSPITAL_COMMUNITY)
Admission: EM | Admit: 2021-02-14 | Discharge: 2021-02-14 | Disposition: A | Discharge status: Home / Self Care | Payer: Medicaid Other | Attending: Registered Nurse | Admitting: Registered Nurse

## 2021-02-14 ENCOUNTER — Other Ambulatory Visit: Payer: Self-pay

## 2021-02-14 ENCOUNTER — Telehealth (HOSPITAL_COMMUNITY): Payer: Self-pay | Admitting: Professional

## 2021-02-14 ENCOUNTER — Encounter (HOSPITAL_COMMUNITY): Payer: Self-pay | Admitting: Registered Nurse

## 2021-02-14 DIAGNOSIS — R45851 Suicidal ideations: Secondary | ICD-10-CM | POA: Insufficient documentation

## 2021-02-14 DIAGNOSIS — F333 Major depressive disorder, recurrent, severe with psychotic symptoms: Secondary | ICD-10-CM | POA: Diagnosis present

## 2021-02-14 DIAGNOSIS — F419 Anxiety disorder, unspecified: Secondary | ICD-10-CM | POA: Insufficient documentation

## 2021-02-14 DIAGNOSIS — F322 Major depressive disorder, single episode, severe without psychotic features: Secondary | ICD-10-CM | POA: Diagnosis present

## 2021-02-14 DIAGNOSIS — F410 Panic disorder [episodic paroxysmal anxiety] without agoraphobia: Secondary | ICD-10-CM | POA: Insufficient documentation

## 2021-02-14 NOTE — ED Provider Notes (Signed)
[facility] Medical Screening Exam  Patient Name: Samantha [name] MRN: [id number] Date of Evaluation: 02/14/21 Chief Complaint:   Diagnosis:  Final diagnoses:  MDD (major depressive disorder), recurrent, severe, with psychosis (HCC)  Anxiety  Panic attacks  Passive suicidal ideations    History of Present illness: Samantha [name] is a 35 y.o. female patient presented to [facility] as a walk in with complaints of worsening anxiety, depression, and passive suicidal ideation  Samantha [name], 35 y.o., female patient seen face to face by this provider, consulted with Dr. Alinda Sierras; and chart reviewed on 02/14/21.  On evaluation Samantha [name] reports she has had worsening anxiety and depression.  Patient states that she has a chronic history of passive suicidal thoughts but no prior history of suicidal attempt.  Patient reports passive suicidal thoughts almost daily but would never do anything to hurt or kill her self related to having 4 children at home that she takes care of her.  Patient reports she has had outpatient psychiatric services before and has been on multiple psychotropic medications but has found nothing that really helped.  Reports she has tried everything except for mood stabilizer.  Patient reporting she would like to get set up with outpatient psychiatric services for medication management and therapy.  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  Patient is able to contract for safety stating that her mother is her primary support.  At this time patient denies suicidal/self-harm/homicidal ideations, psychosis, and paranoia. During evaluation Samantha [name] is sitting upright in chair in no acute distress.  She is alert/oriented x 4; calm/cooperative; and mood congruent with affect.  She is speaking in a clear tone at moderate volume, and normal pace; with good eye contact.  Her thought process is coherent  and relevant; There is no indication that she is currently responding to internal/external stimuli or experiencing delusional thought content; and she has denied suicidal/self-harm/homicidal ideation, psychosis, and paranoia.   Patient has remained calm throughout assessment and has answered questions appropriately.   At this time Samantha [name] is educated and verbalizes understanding of mental health resources and other crisis services in the community. She is instructed to call 911 and present to the nearest emergency room should she experience any suicidal/homicidal ideation, auditory/visual/hallucinations, or detrimental worsening of her mental health condition.    Psychiatric Specialty Exam  Presentation  General Appearance:Appropriate for Environment; Casual  Eye Contact:Good  Speech:Clear and Coherent; Normal Rate  Speech Volume:Normal  Handedness:No data recorded  Mood and Affect  Mood:Anxious; Euthymic  Affect:Appropriate; Congruent   Thought Process  Thought Processes:Coherent; Goal Directed  Descriptions of Associations:Intact  Orientation:Full (Time, Place and Person)  Thought Content:Logical; WDL    Hallucinations:None  Ideas of Reference:None  Suicidal Thoughts:Yes, Passive Without Intent; Without Plan (Patient reporting a chronic history of passive suicidl ideation.  Denies prior suicide attempt)  Homicidal Thoughts:No   Sensorium  Memory:Immediate Good; Recent Good; Remote Good  Judgment:Intact  Insight:Present   Executive Functions  Concentration:Good  Attention Span:Good  Recall:Good  Fund of Knowledge:Good  Language:Good   Psychomotor Activity  Psychomotor Activity: No data recorded  Assets  Assets:Communication Skills; Desire for Improvement; Financial Resources/Insurance; Housing; Leisure Time; Physical Health; Social Support   Sleep  Sleep:Good  Number of hours:  No data recorded  Nutritional Assessment (For OBS and  FBC admissions only) Has the patient had a weight loss or gain of 10 pounds  or more in the last 3 months?: No Has the patient had a decrease in food intake/or appetite?: No Does the patient have dental problems?: No Does the patient have eating habits or behaviors that may be indicators of an eating disorder including binging or inducing vomiting?: No Has the patient recently lost weight without trying?: 0 Has the patient been eating poorly because of a decreased appetite?: 0 Malnutrition Screening Tool Score: 0    Physical Exam: Physical Exam Vitals and nursing note reviewed.  Constitutional:      Appearance: Normal appearance.  Cardiovascular:     Rate and Rhythm: Normal rate.  Pulmonary:     Effort: Pulmonary effort is normal.  Musculoskeletal:        General: Normal range of motion.  Neurological:     Mental Status: She is alert and oriented to person, place, and time.  Psychiatric:        Attention and Perception: Attention and perception normal.        Mood and Affect: Affect normal. Mood is anxious.        Speech: Speech normal.        Behavior: Behavior normal. Behavior is cooperative.        Thought Content: Thought content normal. Thought content is not paranoid or delusional. Thought content does not include homicidal or suicidal ideation.        Cognition and Memory: Cognition and memory normal.        Judgment: Judgment normal.   Review of Systems  Constitutional: Negative.   HENT: Negative.    Eyes: Negative.   Respiratory: Negative.    Cardiovascular: Negative.   Gastrointestinal: Negative.   Genitourinary: Negative.   Musculoskeletal: Negative.   Skin: Negative.   Neurological: Negative.   Endo/Heme/Allergies: Negative.   Psychiatric/Behavioral:  Positive for depression. Negative for hallucinations. Suicidal ideas: Passive thoughts but no prior suicide attempt.The patient is nervous/anxious.        Patient reporting some days depression and anxiety is so  bad that she does not know if she is going to come in, and does not feel herself  Blood pressure (!) 138/94, pulse 67, temperature 98.3 F (36.8 C), temperature source Oral, resp. rate 16, SpO2 99 %. There is no height or weight on file to calculate BMI.  Musculoskeletal: Strength & Muscle Tone: within normal limits Gait & Station: normal Patient leans: N/A   [facility] MSE Discharge Disposition for Follow up and Recommendations: Based on my evaluation the patient does not appear to have an emergency medical condition and can be discharged with resources and follow up care in outpatient services for Medication Management and Intensive Outpatient Program   Follow-up Information     [facility]   Specialty: Behavioral Health Why: Referral sent in for outpatient therapy.  If you don't hear anything by 10:00 tomorrow call the above number to set up services Contact information: [address] [place] [address] [phone]                  Discharge Instructions      You have been referred to [facility] ([facility]) Intensive Outpatient Program.  You should receive a call today no later than tomorrow.  If you happen to miss or don't receive a call please call the number listed above for appointment information.    [facility] [employer] Crisis:  [facility] [employer] [phone]  Safety Plan Kyndahl Brad Mcgaughy will  reach out to her mother, call 911 or call mobile crisis or go to nearest emergency room if condition worsens or if suicidal thoughts become active Patients' will follow up with [facility] for outpatient psychiatric services (Intensive outpatient program)  therapy/medication management.  The suicide prevention education provided includes the following:  Suicide risk factors Suicide prevention and interventions National Suicide Hotline telephone  number [facility] assessment telephone number [facility] Emergency Assistance 911 [facility] and/or Residential Mobile Crisis Unit telephone number Request made of family/significant other to:  Patient Remove weapons (e.g., guns, rifles, knives), all items previously/currently identified as safety concern.   Remove drugs/medications (over the counter, prescriptions, illicit drugs), all items previously/currently identified as a safety concern.        Deerica Waszak, NP 02/14/2021, 11:55 AM

## 2021-02-14 NOTE — Discharge Instructions (Addendum)
You have been referred to [facility] ([facility]) Intensive Outpatient Program.  You should receive a call today no later than tomorrow.  If you happen to miss or don't receive a call please call the number listed above for appointment information.    [place] [employer] Crisis:  [facility] [phone]  Safety Plan Samantha [name] will reach out to her mother, call 911 or call mobile crisis or go to nearest emergency room if condition worsens or if suicidal thoughts become active Patients' will follow up with [facility] for outpatient psychiatric services (Intensive outpatient program)  therapy/medication management.  The suicide prevention education provided includes the following:  Suicide risk factors Suicide prevention and interventions National Suicide Hotline telephone number [facility] assessment telephone number [facility] Emergency Assistance 911 [facility] and/or Residential Mobile Crisis Unit telephone number Request made of family/significant other to:  Patient Remove weapons (e.g., guns, rifles, knives), all items previously/currently identified as safety concern.   Remove drugs/medications (over the counter, prescriptions, illicit drugs), all items previously/currently identified as a safety concern.

## 2021-04-12 DIAGNOSIS — Z419 Encounter for procedure for purposes other than remedying health state, unspecified: Secondary | ICD-10-CM | POA: Diagnosis not present

## 2021-05-13 DIAGNOSIS — Z419 Encounter for procedure for purposes other than remedying health state, unspecified: Secondary | ICD-10-CM | POA: Diagnosis not present

## 2021-06-13 DIAGNOSIS — Z419 Encounter for procedure for purposes other than remedying health state, unspecified: Secondary | ICD-10-CM | POA: Diagnosis not present

## 2021-07-11 DIAGNOSIS — Z419 Encounter for procedure for purposes other than remedying health state, unspecified: Secondary | ICD-10-CM | POA: Diagnosis not present

## 2021-08-11 DIAGNOSIS — Z419 Encounter for procedure for purposes other than remedying health state, unspecified: Secondary | ICD-10-CM | POA: Diagnosis not present

## 2021-08-14 ENCOUNTER — Encounter (HOSPITAL_COMMUNITY): Payer: Self-pay | Admitting: Emergency Medicine

## 2021-08-14 ENCOUNTER — Emergency Department (HOSPITAL_COMMUNITY)
Admission: EM | Admit: 2021-08-14 | Discharge: 2021-08-14 | Disposition: A | Discharge status: Home / Self Care | Payer: Medicaid Other | Attending: Emergency Medicine | Admitting: Emergency Medicine

## 2021-08-14 DIAGNOSIS — Z5321 Procedure and treatment not carried out due to patient leaving prior to being seen by health care provider: Secondary | ICD-10-CM | POA: Diagnosis not present

## 2021-08-14 NOTE — ED Triage Notes (Signed)
Per patient, states she is here to get meds corrected-states she does not know what is wrong with her-patient with blunt affect, no answering triage questions ?

## 2021-08-14 NOTE — ED Notes (Signed)
Pt seen leaving triage 4 and seen exiting the lobby. ?

## 2021-09-10 DIAGNOSIS — Z419 Encounter for procedure for purposes other than remedying health state, unspecified: Secondary | ICD-10-CM | POA: Diagnosis not present

## 2021-10-06 DIAGNOSIS — H5213 Myopia, bilateral: Secondary | ICD-10-CM | POA: Diagnosis not present

## 2021-10-11 DIAGNOSIS — Z419 Encounter for procedure for purposes other than remedying health state, unspecified: Secondary | ICD-10-CM | POA: Diagnosis not present

## 2021-11-10 DIAGNOSIS — Z419 Encounter for procedure for purposes other than remedying health state, unspecified: Secondary | ICD-10-CM | POA: Diagnosis not present

## 2021-12-11 DIAGNOSIS — Z419 Encounter for procedure for purposes other than remedying health state, unspecified: Secondary | ICD-10-CM | POA: Diagnosis not present

## 2022-01-11 DIAGNOSIS — Z419 Encounter for procedure for purposes other than remedying health state, unspecified: Secondary | ICD-10-CM | POA: Diagnosis not present

## 2022-02-10 DIAGNOSIS — Z419 Encounter for procedure for purposes other than remedying health state, unspecified: Secondary | ICD-10-CM | POA: Diagnosis not present

## 2022-03-13 DIAGNOSIS — Z419 Encounter for procedure for purposes other than remedying health state, unspecified: Secondary | ICD-10-CM | POA: Diagnosis not present

## 2022-04-12 DIAGNOSIS — Z419 Encounter for procedure for purposes other than remedying health state, unspecified: Secondary | ICD-10-CM | POA: Diagnosis not present

## 2022-05-13 DIAGNOSIS — Z419 Encounter for procedure for purposes other than remedying health state, unspecified: Secondary | ICD-10-CM | POA: Diagnosis not present

## 2022-06-13 DIAGNOSIS — Z419 Encounter for procedure for purposes other than remedying health state, unspecified: Secondary | ICD-10-CM | POA: Diagnosis not present

## 2022-07-12 DIAGNOSIS — Z419 Encounter for procedure for purposes other than remedying health state, unspecified: Secondary | ICD-10-CM | POA: Diagnosis not present

## 2022-08-12 DIAGNOSIS — Z419 Encounter for procedure for purposes other than remedying health state, unspecified: Secondary | ICD-10-CM | POA: Diagnosis not present

## 2022-09-11 DIAGNOSIS — Z419 Encounter for procedure for purposes other than remedying health state, unspecified: Secondary | ICD-10-CM | POA: Diagnosis not present

## 2022-10-12 DIAGNOSIS — Z419 Encounter for procedure for purposes other than remedying health state, unspecified: Secondary | ICD-10-CM | POA: Diagnosis not present

## 2022-11-11 DIAGNOSIS — Z419 Encounter for procedure for purposes other than remedying health state, unspecified: Secondary | ICD-10-CM | POA: Diagnosis not present

## 2022-12-12 DIAGNOSIS — Z419 Encounter for procedure for purposes other than remedying health state, unspecified: Secondary | ICD-10-CM | POA: Diagnosis not present

## 2023-01-12 DIAGNOSIS — Z419 Encounter for procedure for purposes other than remedying health state, unspecified: Secondary | ICD-10-CM | POA: Diagnosis not present

## 2023-02-11 DIAGNOSIS — Z419 Encounter for procedure for purposes other than remedying health state, unspecified: Secondary | ICD-10-CM | POA: Diagnosis not present

## 2023-03-14 DIAGNOSIS — Z419 Encounter for procedure for purposes other than remedying health state, unspecified: Secondary | ICD-10-CM | POA: Diagnosis not present

## 2023-04-13 DIAGNOSIS — Z419 Encounter for procedure for purposes other than remedying health state, unspecified: Secondary | ICD-10-CM | POA: Diagnosis not present

## 2023-05-14 DIAGNOSIS — Z419 Encounter for procedure for purposes other than remedying health state, unspecified: Secondary | ICD-10-CM | POA: Diagnosis not present

## 2023-06-14 DIAGNOSIS — Z419 Encounter for procedure for purposes other than remedying health state, unspecified: Secondary | ICD-10-CM | POA: Diagnosis not present

## 2023-07-12 DIAGNOSIS — Z419 Encounter for procedure for purposes other than remedying health state, unspecified: Secondary | ICD-10-CM | POA: Diagnosis not present

## 2023-08-23 DIAGNOSIS — Z419 Encounter for procedure for purposes other than remedying health state, unspecified: Secondary | ICD-10-CM | POA: Diagnosis not present

## 2023-09-22 DIAGNOSIS — Z419 Encounter for procedure for purposes other than remedying health state, unspecified: Secondary | ICD-10-CM | POA: Diagnosis not present

## 2023-10-23 DIAGNOSIS — Z419 Encounter for procedure for purposes other than remedying health state, unspecified: Secondary | ICD-10-CM | POA: Diagnosis not present

## 2023-11-22 DIAGNOSIS — Z419 Encounter for procedure for purposes other than remedying health state, unspecified: Secondary | ICD-10-CM | POA: Diagnosis not present

## 2023-12-23 DIAGNOSIS — Z419 Encounter for procedure for purposes other than remedying health state, unspecified: Secondary | ICD-10-CM | POA: Diagnosis not present

## 2024-01-23 DIAGNOSIS — Z419 Encounter for procedure for purposes other than remedying health state, unspecified: Secondary | ICD-10-CM | POA: Diagnosis not present

## 2024-03-24 DIAGNOSIS — Z419 Encounter for procedure for purposes other than remedying health state, unspecified: Secondary | ICD-10-CM | POA: Diagnosis not present
